# Patient Record
Sex: Male | Born: 1985 | Race: Black or African American | Hispanic: No | Marital: Married | Smoking: Never smoker
Health system: Southern US, Community
[De-identification: ages and names within clinical notes are randomized; demographics above are authoritative.]

## PROBLEM LIST (undated history)

## (undated) DIAGNOSIS — G8929 Other chronic pain: Secondary | ICD-10-CM

## (undated) DIAGNOSIS — R519 Headache, unspecified: Secondary | ICD-10-CM

## (undated) DIAGNOSIS — E669 Obesity, unspecified: Secondary | ICD-10-CM

## (undated) DIAGNOSIS — M1990 Unspecified osteoarthritis, unspecified site: Secondary | ICD-10-CM

## (undated) DIAGNOSIS — I499 Cardiac arrhythmia, unspecified: Secondary | ICD-10-CM

## (undated) DIAGNOSIS — E785 Hyperlipidemia, unspecified: Secondary | ICD-10-CM

## (undated) DIAGNOSIS — I10 Essential (primary) hypertension: Secondary | ICD-10-CM

## (undated) DIAGNOSIS — F32A Depression, unspecified: Secondary | ICD-10-CM

## (undated) HISTORY — DX: Depression, unspecified: F32.A

## (undated) HISTORY — DX: Hyperlipidemia, unspecified: E78.5

## (undated) HISTORY — PX: ANKLE SURGERY: SHX546

## (undated) HISTORY — DX: Obesity, unspecified: E66.9

## (undated) HISTORY — DX: Headache, unspecified: R51.9

## (undated) HISTORY — DX: Other chronic pain: G89.29

---

## 2010-04-29 HISTORY — PX: ANKLE FRACTURE SURGERY: SHX122

## 2020-05-11 DIAGNOSIS — Z1152 Encounter for screening for COVID-19: Secondary | ICD-10-CM | POA: Diagnosis not present

## 2020-05-21 ENCOUNTER — Encounter (HOSPITAL_BASED_OUTPATIENT_CLINIC_OR_DEPARTMENT_OTHER): Payer: Self-pay | Admitting: Emergency Medicine

## 2020-05-21 ENCOUNTER — Emergency Department (HOSPITAL_BASED_OUTPATIENT_CLINIC_OR_DEPARTMENT_OTHER): Payer: BC Managed Care – PPO

## 2020-05-21 ENCOUNTER — Emergency Department (HOSPITAL_BASED_OUTPATIENT_CLINIC_OR_DEPARTMENT_OTHER)
Admission: EM | Admit: 2020-05-21 | Discharge: 2020-05-21 | Disposition: A | Discharge status: Home / Self Care | Payer: BC Managed Care – PPO | Attending: Emergency Medicine | Admitting: Emergency Medicine

## 2020-05-21 ENCOUNTER — Other Ambulatory Visit: Payer: Self-pay

## 2020-05-21 DIAGNOSIS — R030 Elevated blood-pressure reading, without diagnosis of hypertension: Secondary | ICD-10-CM | POA: Diagnosis not present

## 2020-05-21 DIAGNOSIS — R519 Headache, unspecified: Secondary | ICD-10-CM | POA: Diagnosis not present

## 2020-05-21 DIAGNOSIS — D649 Anemia, unspecified: Secondary | ICD-10-CM | POA: Diagnosis not present

## 2020-05-21 DIAGNOSIS — I10 Essential (primary) hypertension: Secondary | ICD-10-CM | POA: Insufficient documentation

## 2020-05-21 DIAGNOSIS — D509 Iron deficiency anemia, unspecified: Secondary | ICD-10-CM | POA: Insufficient documentation

## 2020-05-21 HISTORY — DX: Essential (primary) hypertension: I10

## 2020-05-21 LAB — BASIC METABOLIC PANEL
Anion gap: 8 (ref 5–15)
BUN: 11 mg/dL (ref 6–20)
CO2: 23 mmol/L (ref 22–32)
Calcium: 8.7 mg/dL — ABNORMAL LOW (ref 8.9–10.3)
Chloride: 104 mmol/L (ref 98–111)
Creatinine, Ser: 1.03 mg/dL (ref 0.61–1.24)
GFR, Estimated: >60 mL/min (ref >60)
Glucose, Bld: 109 mg/dL — ABNORMAL HIGH (ref 70–99)
Potassium: 4 mmol/L (ref 3.5–5.1)
Sodium: 135 mmol/L (ref 135–145)

## 2020-05-21 LAB — CBC WITH DIFFERENTIAL/PLATELET
Abs Immature Granulocytes: 0.01 10*3/uL (ref 0.00–0.07)
Basophils Absolute: 0 10*3/uL (ref 0.0–0.1)
Basophils Relative: 0 %
Eosinophils Absolute: 0.1 10*3/uL (ref 0.0–0.5)
Eosinophils Relative: 1 %
HCT: 38.6 % — ABNORMAL LOW (ref 39.0–52.0)
Hemoglobin: 12.2 g/dL — ABNORMAL LOW (ref 13.0–17.0)
Immature Granulocytes: 0 %
Lymphocytes Relative: 35 %
Lymphs Abs: 2.1 10*3/uL (ref 0.7–4.0)
MCH: 23.6 pg — ABNORMAL LOW (ref 26.0–34.0)
MCHC: 31.6 g/dL (ref 30.0–36.0)
MCV: 74.5 fL — ABNORMAL LOW (ref 80.0–100.0)
Monocytes Absolute: 0.5 10*3/uL (ref 0.1–1.0)
Monocytes Relative: 8 %
Neutro Abs: 3.4 10*3/uL (ref 1.7–7.7)
Neutrophils Relative %: 56 %
Platelets: 249 10*3/uL (ref 150–400)
RBC: 5.18 MIL/uL (ref 4.22–5.81)
RDW: 15.7 % — ABNORMAL HIGH (ref 11.5–15.5)
WBC: 6 10*3/uL (ref 4.0–10.5)
nRBC: 0 % (ref 0.0–0.2)

## 2020-05-21 MED ORDER — DIPHENHYDRAMINE HCL 25 MG PO CAPS
50.0000 mg | ORAL_CAPSULE | Freq: Once | ORAL | Status: AC
  Administered 2020-05-21: 50 mg via ORAL
  Filled 2020-05-21: qty 2

## 2020-05-21 MED ORDER — METOCLOPRAMIDE HCL 5 MG/ML IJ SOLN
10.0000 mg | Freq: Once | INTRAMUSCULAR | Status: AC
  Administered 2020-05-21: 10 mg via INTRAMUSCULAR
  Filled 2020-05-21: qty 2

## 2020-05-21 NOTE — ED Provider Notes (Signed)
[facility] EMERGENCY DEPARTMENT Provider Note   CSN: [id number] Arrival date & time: 05/21/20  5093     History Chief Complaint  Patient presents with  . Headache    Chris Carlson is a 35 y.o. male.  Patient presents to the emergency department for evaluation of high blood pressure and headache.  Patient states that he has had a generalized headache over the past 1 week.  He has had nausea at times without vomiting.  He has had mild nasal congestion but no sinus pressure or tooth aches.  No vision disturbances.  He denies associated body aches, chills, fever.  No chest pain or shortness of breath.  He has not been coughing.  No known COVID contacts.  Patient states that he was not previously on blood pressure medication.  It has been years since he has seen a primary care doctor for general physical.  Patient states that he checked his blood pressure at home yesterday and it was in the low 200s systolic range.        Past Medical History:  Diagnosis Date  . Hypertension     There are no problems to display for this patient.   History reviewed. No pertinent surgical history.     History reviewed. No pertinent family history.  Social History   Tobacco Use  . Smoking status: Never Smoker  Substance Use Topics  . Alcohol use: Never  . Drug use: Never    Home Medications Prior to Admission medications   Not on File    Allergies    Patient has no allergy information on record.  Review of Systems   Review of Systems  Constitutional: Negative for fever.  HENT: Positive for congestion. Negative for dental problem, rhinorrhea and sinus pressure.   Eyes: Negative for photophobia, discharge, redness and visual disturbance.  Respiratory: Negative for shortness of breath.   Cardiovascular: Negative for chest pain.  Gastrointestinal: Positive for nausea. Negative for vomiting.  Musculoskeletal: Negative for gait problem, neck pain and neck stiffness.  Skin:  Negative for rash.  Neurological: Positive for headaches. Negative for syncope, speech difficulty, weakness, light-headedness and numbness.  Psychiatric/Behavioral: Negative for confusion.    Physical Exam Updated Vital Signs BP 133/67   Pulse 80   Temp 98.4 F (36.9 C) (Oral)   Resp 13   Ht 5\' 8"  (1.727 m)   Wt (!) 167.8 kg   SpO2 100%   BMI 56.26 kg/m   Physical Exam Vitals and nursing note reviewed.  Constitutional:      Appearance: He is well-developed and well-nourished.  HENT:     Head: Normocephalic and atraumatic.     Right Ear: Tympanic membrane, ear canal and external ear normal.     Left Ear: Tympanic membrane, ear canal and external ear normal.     Nose: Nose normal.     Mouth/Throat:     Mouth: Oropharynx is clear and moist and mucous membranes are normal.     Pharynx: Uvula midline.  Eyes:     General: Lids are normal.     Extraocular Movements: EOM normal.     Conjunctiva/sclera: Conjunctivae normal.     Pupils: Pupils are equal, round, and reactive to light.  Cardiovascular:     Rate and Rhythm: Normal rate and regular rhythm.  Pulmonary:     Effort: Pulmonary effort is normal.     Breath sounds: Normal breath sounds.  Abdominal:     Palpations: Abdomen is soft.  Tenderness: There is no abdominal tenderness.  Musculoskeletal:        General: Normal range of motion.     Cervical back: Normal range of motion and neck supple. No tenderness or bony tenderness. Normal range of motion.  Skin:    General: Skin is warm and dry.  Neurological:     Mental Status: He is alert and oriented to person, place, and time.     GCS: GCS eye subscore is 4. GCS verbal subscore is 5. GCS motor subscore is 6.     Cranial Nerves: No cranial nerve deficit.     Sensory: No sensory deficit.     Motor: No abnormal muscle tone.     Coordination: He displays a negative Romberg sign. Coordination normal.     Gait: Gait normal.     Deep Tendon Reflexes: Strength normal and  reflexes are normal and symmetric.  Psychiatric:        Mood and Affect: Mood and affect normal.     ED Results / Procedures / Treatments   Labs (all labs ordered are listed, but only abnormal results are displayed) Labs Reviewed  CBC WITH DIFFERENTIAL/PLATELET  BASIC METABOLIC PANEL    ED ECG REPORT   Date: 05/21/2020  Rate: 78  Rhythm: normal sinus rhythm  QRS Axis: normal  Intervals: normal  ST/T Wave abnormalities: nonspecific T wave changes  Conduction Disutrbances:none  Narrative Interpretation:   Old EKG Reviewed: none available  I have personally reviewed the EKG tracing and agree with the computerized printout as noted.  Radiology CT Head Wo Contrast  Result Date: 05/21/2020 CLINICAL DATA:  Headache x1 week EXAM: CT HEAD WITHOUT CONTRAST TECHNIQUE: Contiguous axial images were obtained from the base of the skull through the vertex without intravenous contrast. COMPARISON:  None. FINDINGS: Brain: No evidence of acute infarction, hemorrhage, hydrocephalus, extra-axial collection or mass lesion/mass effect. Vascular: No hyperdense vessel or unexpected calcification. Skull: Normal. Negative for fracture or focal lesion. Sinuses/Orbits: The visualized paranasal sinuses are essentially clear. The mastoid air cells are unopacified. Other: None. IMPRESSION: Normal head CT. Electronically Signed   By: Charline Bills M.D.   On: 05/21/2020 11:03    Procedures Procedures (including critical care time)  Medications Ordered in ED Medications  metoCLOPramide (REGLAN) injection 10 mg (has no administration in time range)  diphenhydrAMINE (BENADRYL) capsule 50 mg (has no administration in time range)    ED Course  I have reviewed the triage vital signs and the nursing notes.  Pertinent labs & imaging results that were available during my care of the patient were reviewed by me and considered in my medical decision making (see chart for details).  Patient seen and examined.   CT ordered in triage, was negative.  Will check basic labs given that patient has not seen a doctor in a long time and potentially has uncontrolled high blood pressure.  EKG reviewed.  We will give IM Reglan and oral Benadryl for treatment of headache.  We discussed the importance of PCP follow-up for blood pressure rechecks.  RN to perform manual blood pressure.  Vital signs reviewed and are as follows: BP 133/67   Pulse 80   Temp 98.4 F (36.9 C) (Oral)   Resp 13   Ht 5\' 8"  (1.727 m)   Wt (!) 167.8 kg   SpO2 100%   BMI 56.26 kg/m   1:07 PM patient rechecked.  Reviewed results of lab work including mild anemia.  He is a little sleepy but  states that his headache is down to a 2 out of 10.  Plan for discharged home at this time.  Again stressed need for primary care follow-up.  Will provide referrals.  Patient urged to return with worsening symptoms or other concerns. Patient verbalized understanding and agrees with plan.      MDM Rules/Calculators/A&P                          Patient without high-risk features of headache including: sudden onset/thunderclap HA, no similar headache in past, altered mental status, accompanying seizure, headache with exertion, age > 43, history of immunocompromise, neck or shoulder pain, fever, use of anticoagulation, family history of spontaneous SAH, concomitant drug use, toxic exposure.   Patient has a normal complete neurological exam, normal vital signs, normal level of consciousness, no signs of meningismus, is well-appearing/non-toxic appearing, no signs of trauma.   CT imaging without abnormalities.   No dangerous or life-threatening conditions suspected or identified by history, physical exam, and by work-up. No indications for hospitalization identified.     Final Clinical Impression(s) / ED Diagnoses Final diagnoses:  Acute nonintractable headache, unspecified headache type  Elevated blood pressure reading  Microcytic anemia    Rx /  DC Orders ED Discharge Orders    None       Renne Crigler, PA-C 05/21/20 1308    Alvira Monday, MD 05/22/20 1153

## 2020-05-21 NOTE — ED Triage Notes (Signed)
Pt arrives pov with driver, c/o HA and dizziness. Pt endorses hx of HTN, not taking medications.

## 2020-05-21 NOTE — Discharge Instructions (Signed)
Please read and follow all provided instructions.  Your diagnoses today include:  1. Acute nonintractable headache, unspecified headache type   2. Elevated blood pressure reading   3. Microcytic anemia     Tests performed today include:  CT of your head which was normal and did not show any serious cause of your headache Blood cell counts (white, red, and platelets) - slightly low red blood cell count Electrolytes  Kidney function test - was normal  Vital signs. See below for your results today.   Medications:  In the Emergency Department you received:  Reglan - antinausea/headache medication  Benadryl - antihistamine to counteract potential side effects of reglan  Take any prescribed medications only as directed.  Additional information:  Follow any educational materials contained in this packet.  You are having a headache. No specific cause was found today for your headache. It may have been a migraine or other cause of headache. Stress, anxiety, fatigue, and depression are common triggers for headaches.   Your headache today does not appear to be life-threatening or require hospitalization, but often the exact cause of headaches is not determined in the emergency department. Therefore, follow-up with your doctor is very important to find out what may have caused your headache and whether or not you need any further diagnostic testing or treatment.   Sometimes headaches can appear benign (not harmful), but then more serious symptoms can develop which should prompt an immediate re-evaluation by your doctor or the emergency department.  BE VERY CAREFUL not to take multiple medicines containing Tylenol (also called acetaminophen). Doing so can lead to an overdose which can damage your liver and cause liver failure and possibly death.   Follow-up instructions: Please follow-up with your primary care provider in the next 3 days for further evaluation of your symptoms.   Return  instructions:   Please return to the Emergency Department if you experience worsening symptoms.  Return if the medications do not resolve your headache, if it recurs, or if you have multiple episodes of vomiting or cannot keep down fluids.  Return if you have a change from the usual headache.  RETURN IMMEDIATELY IF you:  Develop a sudden, severe headache  Develop confusion or become poorly responsive or faint  Develop a fever above 100.62F or problem breathing  Have a change in speech, vision, swallowing, or understanding  Develop new weakness, numbness, tingling, incoordination in your arms or legs  Have a seizure  Please return if you have any other emergent concerns.  Additional Information:  Your vital signs today were: BP (!) 160/78 (BP Location: Left Arm)   Pulse 80   Temp 98.4 F (36.9 C) (Oral)   Resp 16   Ht 5\' 8"  (1.727 m)   Wt (!) 167.8 kg   SpO2 99%   BMI 56.26 kg/m  If your blood pressure (BP) was elevated above 135/85 this visit, please have this repeated by your doctor within one month. --------------

## 2020-05-31 ENCOUNTER — Other Ambulatory Visit: Payer: Self-pay

## 2020-05-31 ENCOUNTER — Ambulatory Visit (INDEPENDENT_AMBULATORY_CARE_PROVIDER_SITE_OTHER): Payer: BC Managed Care – PPO | Admitting: Medical

## 2020-05-31 ENCOUNTER — Encounter: Payer: Self-pay | Admitting: Medical

## 2020-05-31 VITALS — BP 132/82 | HR 86 | Ht 67.0 in | Wt >= 6400 oz

## 2020-05-31 DIAGNOSIS — Z6841 Body Mass Index (BMI) 40.0 and over, adult: Secondary | ICD-10-CM | POA: Diagnosis not present

## 2020-05-31 DIAGNOSIS — I10 Essential (primary) hypertension: Secondary | ICD-10-CM | POA: Insufficient documentation

## 2020-05-31 DIAGNOSIS — R519 Headache, unspecified: Secondary | ICD-10-CM | POA: Diagnosis not present

## 2020-05-31 DIAGNOSIS — D509 Iron deficiency anemia, unspecified: Secondary | ICD-10-CM | POA: Insufficient documentation

## 2020-05-31 DIAGNOSIS — G8929 Other chronic pain: Secondary | ICD-10-CM | POA: Insufficient documentation

## 2020-05-31 DIAGNOSIS — J351 Hypertrophy of tonsils: Secondary | ICD-10-CM | POA: Insufficient documentation

## 2020-05-31 DIAGNOSIS — Z8249 Family history of ischemic heart disease and other diseases of the circulatory system: Secondary | ICD-10-CM

## 2020-05-31 DIAGNOSIS — Z8042 Family history of malignant neoplasm of prostate: Secondary | ICD-10-CM | POA: Insufficient documentation

## 2020-05-31 MED ORDER — ATENOLOL 25 MG PO TABS: 25.0000 mg | ORAL_TABLET | Freq: Every day | ORAL | 2 refills | Status: DC

## 2020-05-31 MED ORDER — BUTALBITAL-APAP-CAFFEINE 50-325-40 MG PO TABS: 1.0000 | ORAL_TABLET | Freq: Four times a day (QID) | ORAL | 0 refills | Status: DC | PRN

## 2020-05-31 NOTE — Progress Notes (Signed)
Subjective:  Chris Carlson is a 35 y.o. male who presents for Chief Complaint  Patient presents with  . New Patient (Initial Visit)    Blood pressure issues and migraines      Here as a new patient.  Was seeing [facility] when younger.  He notes having a lot of migraines of late, sometimes headache awake him out of sleep.  Been having elevated blood pressures,  Dizziness.  Not feeling well.  Checked BP a few weeks ago, 200 SBP readings.  Occasionally gets numbness in fingers bilat.  No vision change, no blurred vision or double vision, no weakness, no syncope, no chest pain, no SOB, no slurred speech.    Went to hospital recently for these concerns.  BP was elevated at first but improved a little in the emergency dept.    He does report snoring, doesn't wake rested, gets daytime somnolence.  No witnessed apnea.    Uncle has hx/o sleep apnea.  Aunt had it before she passed.  Grandmother had sleep apnea.  No other aggravating or relieving factors.    No other c/o.  Past Medical History:  Diagnosis Date  . Chronic headache    since childhood, no prior neurology consult  . Depression   . Obesity    Family History  Problem Relation Age of Onset  . Hypertension Maternal Grandmother   . Diabetes Maternal Grandmother   . Cancer Maternal Grandmother        breast  . Cancer Maternal Grandfather        prostate  . Diabetes Paternal Grandmother   . Hypertension Paternal Grandmother   . Cancer Paternal Grandfather        prostate  . Heart disease Paternal Grandfather        MI  . Stroke Neg Hx      The following portions of the patient's history were reviewed and updated as appropriate: allergies, current medications, past family history, past medical history, past social history, past surgical history and problem list.  ROS Otherwise as in subjective above  Objective: BP 132/82   Pulse 86   Ht 5\' 7"  (1.702 m)   Wt (!) 400 lb 6.4 oz (181.6 kg)   SpO2 98%   BMI  62.71 kg/m   General appearance: alert, no distress, well developed, well nourished, obese African American male HEENT: normocephalic, sclerae anicteric, conjunctiva pink and moist, TMs pearly, nares patent, no discharge or erythema, pharynx with bilat 3+ tonsils Oral cavity: MMM, no lesions Neck: supple, no lymphadenopathy, no thyromegaly, no masses, no bruits or JVD Heart: RRR, normal S1, S2, no murmurs Lungs: CTA bilaterally, no wheezes, rhonchi, or rales Pulses: 2+ radial pulses, 2+ pedal pulses, normal cap refill Ext: no edema   Head CT 05/21/20, normal  BMET lab from 05/21/20 showing glucose 109, calcium low at 8.7, otherwise normal CBC lab showing hgb low at 12.2 and microcytosis   Assessment: Encounter Diagnoses  Name Primary?  . Chronic nonintractable headache, unspecified headache type Yes  . Essential hypertension, benign   . Microcytic anemia   . BMI 60.0-69.9, adult (HCC)   . Family history of prostate cancer   . Family history of hypertension   . Hypertrophy of tonsil      Plan: I reviewed his health history and updated the chart record.  I reviewed his recent emergency department notes from May 21, 2018.  I reviewed his head scan, labs, EKG.  The cause of his headaches could be multifactorial.  I suspect sleep apnea.  There seems to be some underlying hypertension, there is also quite enlarged tonsils which could contribute to sleep apnea.  Given his high reading of over 200 systolic the other day and given the long history of headaches, I will start him on atenolol 25 mg daily for both blood pressure and headache prevention  Begin Fioricet as needed for more severe headache.  Avoid NSAIDs.  He can use Tylenol as needed for milder headaches.  Referral for sleep study.  Consider consult with ENT given the tonsils  We discussed the risk of uncontrolled sleep apnea and hypertension.  Of note from the hospital visit he had some microcytic anemia.  we will  defer this to a later discussion  Family history of prostate cancer in numerous male relatives.  We will begin prostate cancer screening at age 10  We discussed risk of obesity.  He will consider referral to bariatric clinic   Dysen was seen today for new patient (initial visit).  Diagnoses and all orders for this visit:  Chronic nonintractable headache, unspecified headache type  Essential hypertension, benign  Microcytic anemia  BMI 60.0-69.9, adult (HCC)  Family history of prostate cancer  Family history of hypertension  Hypertrophy of tonsil  Other orders -     atenolol (TENORMIN) 25 MG tablet; Take 1 tablet (25 mg total) by mouth daily. -     butalbital-acetaminophen-caffeine (FIORICET) 50-325-40 MG tablet; Take 1-2 tablets by mouth every 6 (six) hours as needed for headache.   Follow up: f/u pending sleep study

## 2020-06-12 DIAGNOSIS — G4730 Sleep apnea, unspecified: Secondary | ICD-10-CM | POA: Diagnosis not present

## 2020-06-27 ENCOUNTER — Telehealth: Payer: Self-pay | Admitting: Medical

## 2020-06-27 NOTE — Telephone Encounter (Signed)
Schedule f/u on sleep study  Hold onto SNAP results until appt

## 2020-06-28 NOTE — Telephone Encounter (Signed)
Patient has appointment scheduled.

## 2020-07-26 ENCOUNTER — Encounter: Payer: Self-pay | Admitting: Medical

## 2020-07-26 ENCOUNTER — Other Ambulatory Visit: Payer: Self-pay | Admitting: Medical

## 2020-07-26 ENCOUNTER — Ambulatory Visit (INDEPENDENT_AMBULATORY_CARE_PROVIDER_SITE_OTHER): Payer: BC Managed Care – PPO | Admitting: Medical

## 2020-07-26 ENCOUNTER — Other Ambulatory Visit: Payer: Self-pay

## 2020-07-26 VITALS — BP 130/94 | HR 86 | Ht 67.0 in | Wt >= 6400 oz

## 2020-07-26 DIAGNOSIS — Z1159 Encounter for screening for other viral diseases: Secondary | ICD-10-CM | POA: Diagnosis not present

## 2020-07-26 DIAGNOSIS — Z6841 Body Mass Index (BMI) 40.0 and over, adult: Secondary | ICD-10-CM

## 2020-07-26 DIAGNOSIS — Z131 Encounter for screening for diabetes mellitus: Secondary | ICD-10-CM | POA: Diagnosis not present

## 2020-07-26 DIAGNOSIS — D509 Iron deficiency anemia, unspecified: Secondary | ICD-10-CM | POA: Diagnosis not present

## 2020-07-26 DIAGNOSIS — G8929 Other chronic pain: Secondary | ICD-10-CM

## 2020-07-26 DIAGNOSIS — Z13220 Encounter for screening for lipoid disorders: Secondary | ICD-10-CM | POA: Insufficient documentation

## 2020-07-26 DIAGNOSIS — Z113 Encounter for screening for infections with a predominantly sexual mode of transmission: Secondary | ICD-10-CM | POA: Diagnosis not present

## 2020-07-26 DIAGNOSIS — Z1211 Encounter for screening for malignant neoplasm of colon: Secondary | ICD-10-CM

## 2020-07-26 DIAGNOSIS — Z8042 Family history of malignant neoplasm of prostate: Secondary | ICD-10-CM

## 2020-07-26 DIAGNOSIS — Z0000 Encounter for general adult medical examination without abnormal findings: Secondary | ICD-10-CM | POA: Diagnosis not present

## 2020-07-26 DIAGNOSIS — G4733 Obstructive sleep apnea (adult) (pediatric): Secondary | ICD-10-CM | POA: Insufficient documentation

## 2020-07-26 DIAGNOSIS — R519 Headache, unspecified: Secondary | ICD-10-CM

## 2020-07-26 DIAGNOSIS — Z23 Encounter for immunization: Secondary | ICD-10-CM | POA: Insufficient documentation

## 2020-07-26 MED ORDER — ATENOLOL 50 MG PO TABS
50.0000 mg | ORAL_TABLET | Freq: Every day | ORAL | 3 refills | Status: DC
Start: 2020-07-26 — End: 2021-11-16

## 2020-07-26 NOTE — Progress Notes (Signed)
Subjective:   HPI  Chris Carlson is a 35 y.o. male who presents for Chief Complaint  Patient presents with  . Annual Exam    Pt present for physical with fasting labs. Pt has no other concerns today     Patient Care Team: Celester Lech, Kermit Balo, PA-C as PCP - General (Family Medicine) Sees dentist Sees eye doctor  Concerns: Here for physical and recheck on recent sleep study  Reviewed their medical, surgical, family, social, medication, and allergy history and updated chart as appropriate.  Past Medical History:  Diagnosis Date  . Chronic headache    since childhood, no prior neurology consult  . Depression   . Obesity     Past Surgical History:  Procedure Laterality Date  . ANKLE SURGERY     right    Family History  Problem Relation Age of Onset  . Anemia Mother   . Hypertension Maternal Grandmother   . Diabetes Maternal Grandmother   . Cancer Maternal Grandmother        breast  . Cancer Maternal Grandfather        prostate  . Diabetes Paternal Grandmother   . Hypertension Paternal Grandmother   . Cancer Paternal Grandfather        prostate  . Heart disease Paternal Grandfather        MI  . Stroke Neg Hx      Current Outpatient Medications:  .  atenolol (TENORMIN) 50 MG tablet, Take 1 tablet (50 mg total) by mouth daily., Disp: 90 tablet, Rfl: 3 .  butalbital-acetaminophen-caffeine (FIORICET) 50-325-40 MG tablet, Take 1-2 tablets by mouth every 6 (six) hours as needed for headache., Disp: 20 tablet, Rfl: 0  No Known Allergies     Review of Systems Constitutional: -fever, -chills, -sweats, -unexpected weight change, -decreased appetite, -fatigue Allergy: -sneezing, -itching, -congestion Dermatology: -changing moles, --rash, -lumps ENT: -runny nose, -ear pain, -sore throat, -hoarseness, -sinus pain, -teeth pain, - ringing in ears, -hearing loss, -nosebleeds Cardiology: -chest pain, -palpitations, -swelling, -difficulty breathing when lying flat, -waking  up short of breath Respiratory: -cough, -shortness of breath, -difficulty breathing with exercise or exertion, -wheezing, -coughing up blood Gastroenterology: -abdominal pain, -nausea, -vomiting, -diarrhea, -constipation, -blood in stool, -changes in bowel movement, -difficulty swallowing or eating Hematology: -bleeding, -bruising  Musculoskeletal: -joint aches, -muscle aches, -joint swelling, -back pain, -neck pain, -cramping, -changes in gait Ophthalmology: denies vision changes, eye redness, itching, discharge Urology: -burning with urination, -difficulty urinating, -blood in urine, -urinary frequency, -urgency, -incontinence Neurology: -headache, -weakness, -tingling, -numbness, -memory loss, -falls, -dizziness Psychology: -depressed mood, -agitation, -sleep problems Male GU: no testicular mass, pain, no lymph nodes swollen, no swelling, no rash.     Objective:  BP (!) 130/94   Pulse 86   Ht 5\' 7"  (1.702 m)   Wt (!) 400 lb 9.6 oz (181.7 kg)   SpO2 97%   BMI 62.74 kg/m   General appearance: alert, no distress, WD/WN, African American male Skin: no worrisome lesions HEENT: normocephalic, conjunctiva/corneas normal, sclerae anicteric, PERRLA, EOMi, nares patent, no discharge or erythema, pharynx normal Oral cavity: MMM, tongue normal, teeth normal Neck: supple, no lymphadenopathy, no thyromegaly, no masses, normal ROM, no bruits Chest: non tender, normal shape and expansion Heart: RRR, normal S1, S2, no murmurs Lungs: CTA bilaterally, no wheezes, rhonchi, or rales Abdomen: +bs, soft, non tender, non distended, no masses, no hepatomegaly, no splenomegaly, no bruits Back: non tender, normal ROM, no scoliosis Musculoskeletal: upper extremities non tender, no obvious deformity, normal ROM  throughout, lower extremities non tender, no obvious deformity, normal ROM throughout Extremities: no edema, no cyanosis, no clubbing Pulses: 2+ symmetric, upper and lower extremities, normal cap  refill Neurological: alert, oriented x 3, CN2-12 intact, strength normal upper extremities and lower extremities, sensation normal throughout, DTRs 2+ throughout, no cerebellar signs, gait normal Psychiatric: normal affect, behavior normal, pleasant  GU: normal male external genitalia,circumcised, nontender, no masses, no hernia, no lymphadenopathy Rectal: deferred   Assessment and Plan :   Encounter Diagnoses  Name Primary?  . Encounter for health maintenance examination in adult Yes  . Need for Tdap vaccination   . Screening for diabetes mellitus   . Screening for lipid disorders   . Screen for STD (sexually transmitted disease)   . Chronic nonintractable headache, unspecified headache type   . OSA (obstructive sleep apnea)   . Family history of prostate cancer   . Microcytic anemia   . Encounter for hepatitis C screening test for low risk patient   . BMI 60.0-69.9, adult (HCC)   . Colon cancer screening   . Need for vaccination     Today you had a preventative care visit or wellness visit.    Topics today may have included healthy lifestyle, diet, exercise, preventative care, vaccinations, sick and well care, proper use of emergency dept and after hours care, as well as other concerns.     Recommendations: Continue to return yearly for your annual wellness and preventative care visits.  This gives [place] a chance to discuss healthy lifestyle, exercise, vaccinations, review your chart record, and perform screenings where appropriate.  I recommend you see your eye doctor yearly for routine vision care.  I recommend you see your dentist yearly for routine dental care including hygiene visits twice yearly.   Vaccination recommendations were reviewed  Counseled on the Tdap (tetanus, diptheria, and acellular pertussis) vaccine.  Vaccine information sheet given. Tdap vaccine given after consent obtained.  Advised yearly flu shot    Screening for cancer: Colon cancer screening:   Age 30  Cancer screening You should do a monthly self testicular exam  We discussed PSA, prostate exam, and prostate cancer screening risks/benefits.  Age 89   Skin cancer screening: Check your skin regularly for new changes, growing lesions, or other lesions of concern Come in for evaluation if you have skin lesions of concern.  Lung cancer screening: If you have a greater than 30 pack year history of tobacco use, then you qualify for lung cancer screening with a chest CT scan  We currently don't have screenings for other cancers besides breast, cervical, colon, and lung cancers.  If you have a strong family history of cancer or have other cancer screening concerns, please let me know.    Bone health: Get at least 150 minutes of aerobic exercise weekly Get weight bearing exercise at least once weekly   Heart health: Get at least 150 minutes of aerobic exercise weekly Limit alcohol It is important to maintain a healthy blood pressure and healthy cholesterol numbers    Separate significant issues discussed: Obesity - referral to bariatric surgery clinic  OSA , nocturnal hypoxia -referral for CPAP  Hypertension and Chronic headaches - increase atenolol dose   Kalan was seen today for annual exam.  Diagnoses and all orders for this visit:  Encounter for health maintenance examination in adult -     CBC with Differential/Platelet -     Iron -     Lipid panel -  Hemoglobin A1c -     TSH -     Comprehensive metabolic panel -     HIV Antibody (routine testing w rflx) -     RPR -     Hepatitis C antibody -     Hepatitis B surface antigen -     GC/Chlamydia Probe Amp  Need for Tdap vaccination  Screening for diabetes mellitus -     Hemoglobin A1c  Screening for lipid disorders -     Lipid panel  Screen for STD (sexually transmitted disease) -     HIV Antibody (routine testing w rflx) -     RPR -     Hepatitis C antibody -     Hepatitis B surface  antigen -     GC/Chlamydia Probe Amp  Chronic nonintractable headache, unspecified headache type  OSA (obstructive sleep apnea) -     Amb Referral to Bariatric Surgery  Family history of prostate cancer  Microcytic anemia -     CBC with Differential/Platelet -     Iron  Encounter for hepatitis C screening test for low risk patient -     Hepatitis C antibody  BMI 60.0-69.9, adult (HCC) -     Amb Referral to Bariatric Surgery  Colon cancer screening -     Fecal occult blood, imunochemical([facility])  Need for vaccination -     Moderna Covid-19 Booster  Other orders -     Tdap vaccine greater than or equal to 7yo IM -     atenolol (TENORMIN) 50 MG tablet; Take 1 tablet (50 mg total) by mouth daily.     Follow-up pending labs, yearly for physical

## 2020-07-27 ENCOUNTER — Encounter: Payer: Self-pay | Admitting: Medical

## 2020-07-27 LAB — LIPID PANEL
Chol/HDL Ratio: 6.3 ratio — ABNORMAL HIGH (ref 0.0–5.0)
Cholesterol, Total: 257 mg/dL — ABNORMAL HIGH (ref 100–199)
HDL: 41 mg/dL (ref >39)
LDL Chol Calc (NIH): 193 mg/dL — ABNORMAL HIGH (ref 0–99)
Triglycerides: 124 mg/dL (ref 0–149)
VLDL Cholesterol Cal: 23 mg/dL (ref 5–40)

## 2020-07-27 LAB — COMPREHENSIVE METABOLIC PANEL
ALT: 23 IU/L (ref 0–44)
AST: 16 IU/L (ref 0–40)
Albumin/Globulin Ratio: 1.3 (ref 1.2–2.2)
Albumin: 4.4 g/dL (ref 4.0–5.0)
Alkaline Phosphatase: 50 IU/L (ref 44–121)
BUN/Creatinine Ratio: 8 — ABNORMAL LOW (ref 9–20)
BUN: 9 mg/dL (ref 6–20)
Bilirubin Total: 0.3 mg/dL (ref 0.0–1.2)
CO2: 18 mmol/L — ABNORMAL LOW (ref 20–29)
Calcium: 9.6 mg/dL (ref 8.7–10.2)
Chloride: 100 mmol/L (ref 96–106)
Creatinine, Ser: 1.1 mg/dL (ref 0.76–1.27)
Globulin, Total: 3.4 g/dL (ref 1.5–4.5)
Glucose: 80 mg/dL (ref 65–99)
Potassium: 4.2 mmol/L (ref 3.5–5.2)
Sodium: 139 mmol/L (ref 134–144)
Total Protein: 7.8 g/dL (ref 6.0–8.5)
eGFR: 89 mL/min/{1.73_m2} (ref >59)

## 2020-07-27 LAB — CBC WITH DIFFERENTIAL/PLATELET
Basophils Absolute: 0 10*3/uL (ref 0.0–0.2)
Basos: 0 %
EOS (ABSOLUTE): 0.1 10*3/uL (ref 0.0–0.4)
Eos: 1 %
Hematocrit: 40.9 % (ref 37.5–51.0)
Hemoglobin: 13.1 g/dL (ref 13.0–17.7)
Immature Grans (Abs): 0 10*3/uL (ref 0.0–0.1)
Immature Granulocytes: 0 %
Lymphocytes Absolute: 3.1 10*3/uL (ref 0.7–3.1)
Lymphs: 42 %
MCH: 23.4 pg — ABNORMAL LOW (ref 26.6–33.0)
MCHC: 32 g/dL (ref 31.5–35.7)
MCV: 73 fL — ABNORMAL LOW (ref 79–97)
Monocytes Absolute: 0.6 10*3/uL (ref 0.1–0.9)
Monocytes: 8 %
Neutrophils Absolute: 3.6 10*3/uL (ref 1.4–7.0)
Neutrophils: 49 %
Platelets: 298 10*3/uL (ref 150–450)
RBC: 5.59 x10E6/uL (ref 4.14–5.80)
RDW: 14.9 % (ref 11.6–15.4)
WBC: 7.4 10*3/uL (ref 3.4–10.8)

## 2020-07-27 LAB — RPR: RPR Ser Ql: NONREACTIVE

## 2020-07-27 LAB — HIV ANTIBODY (ROUTINE TESTING W REFLEX): HIV Screen 4th Generation wRfx: NONREACTIVE

## 2020-07-27 LAB — HEPATITIS B SURFACE ANTIGEN: Hepatitis B Surface Ag: NEGATIVE

## 2020-07-27 LAB — IRON: Iron: 78 ug/dL (ref 38–169)

## 2020-07-27 LAB — HEMOGLOBIN A1C
Est. average glucose Bld gHb Est-mCnc: 126 mg/dL
Hgb A1c MFr Bld: 6 % — ABNORMAL HIGH (ref 4.8–5.6)

## 2020-07-27 LAB — HEPATITIS C ANTIBODY: Hep C Virus Ab: <0.1 s/co ratio (ref 0.0–0.9)

## 2020-07-27 LAB — TSH: TSH: 2.06 u[IU]/mL (ref 0.450–4.500)

## 2020-07-27 NOTE — Progress Notes (Signed)
I will send the letter you provided.

## 2020-07-27 NOTE — Progress Notes (Signed)
There has been changes with referring to [place] [facility] for bariatrics. They want a letter of medical necessity.

## 2020-07-28 NOTE — Progress Notes (Signed)
Referral sent 

## 2020-08-02 ENCOUNTER — Telehealth: Payer: Self-pay

## 2020-08-02 NOTE — Telephone Encounter (Signed)
A representative from [facility] called stating they received a fecal occult blood sample form one of your pts. Chris Carlson but it did not have pts. Name on it so they could not run it. If you wanted to let the pt. Know incase you wanted him to do it again.

## 2020-08-02 NOTE — Telephone Encounter (Signed)
Called pt. LM that stool card could not be completed because it was not labeled with his name on it. I let him know he could pick up another stool card kit here at the office.

## 2020-08-02 NOTE — Telephone Encounter (Signed)
Yes, lets see if he can repeat but make sure he labels it correctly

## 2020-08-03 LAB — SPECIMEN STATUS REPORT

## 2020-08-31 ENCOUNTER — Emergency Department (HOSPITAL_BASED_OUTPATIENT_CLINIC_OR_DEPARTMENT_OTHER): Payer: BC Managed Care – PPO

## 2020-08-31 ENCOUNTER — Encounter (HOSPITAL_BASED_OUTPATIENT_CLINIC_OR_DEPARTMENT_OTHER): Payer: Self-pay | Admitting: *Deleted

## 2020-08-31 ENCOUNTER — Other Ambulatory Visit: Payer: Self-pay

## 2020-08-31 ENCOUNTER — Emergency Department (HOSPITAL_BASED_OUTPATIENT_CLINIC_OR_DEPARTMENT_OTHER)
Admission: EM | Admit: 2020-08-31 | Discharge: 2020-09-01 | Disposition: A | Discharge status: Home / Self Care | Payer: BC Managed Care – PPO | Attending: Emergency Medicine | Admitting: Emergency Medicine

## 2020-08-31 DIAGNOSIS — R079 Chest pain, unspecified: Secondary | ICD-10-CM | POA: Diagnosis not present

## 2020-08-31 DIAGNOSIS — R0789 Other chest pain: Secondary | ICD-10-CM | POA: Diagnosis not present

## 2020-08-31 DIAGNOSIS — I10 Essential (primary) hypertension: Secondary | ICD-10-CM | POA: Diagnosis not present

## 2020-08-31 DIAGNOSIS — Z79899 Other long term (current) drug therapy: Secondary | ICD-10-CM | POA: Insufficient documentation

## 2020-08-31 LAB — BASIC METABOLIC PANEL
Anion gap: 9 (ref 5–15)
BUN: 15 mg/dL (ref 6–20)
CO2: 22 mmol/L (ref 22–32)
Calcium: 8.9 mg/dL (ref 8.9–10.3)
Chloride: 104 mmol/L (ref 98–111)
Creatinine, Ser: 1.07 mg/dL (ref 0.61–1.24)
GFR, Estimated: >60 mL/min (ref >60)
Glucose, Bld: 114 mg/dL — ABNORMAL HIGH (ref 70–99)
Potassium: 3.6 mmol/L (ref 3.5–5.1)
Sodium: 135 mmol/L (ref 135–145)

## 2020-08-31 LAB — CBC
HCT: 39 % (ref 39.0–52.0)
Hemoglobin: 12.3 g/dL — ABNORMAL LOW (ref 13.0–17.0)
MCH: 23.2 pg — ABNORMAL LOW (ref 26.0–34.0)
MCHC: 31.5 g/dL (ref 30.0–36.0)
MCV: 73.6 fL — ABNORMAL LOW (ref 80.0–100.0)
Platelets: 251 10*3/uL (ref 150–400)
RBC: 5.3 MIL/uL (ref 4.22–5.81)
RDW: 15.3 % (ref 11.5–15.5)
WBC: 7.8 10*3/uL (ref 4.0–10.5)
nRBC: 0 % (ref 0.0–0.2)

## 2020-08-31 LAB — TROPONIN I (HIGH SENSITIVITY): Troponin I (High Sensitivity): 5 ng/L (ref <18)

## 2020-08-31 NOTE — ED Provider Notes (Signed)
[facility] EMERGENCY DEPARTMENT Provider Note   CSN: [id number] Arrival date & time: 08/31/20  2005     History Chief Complaint  Patient presents with  . Chest Pain    Chris Carlson is a 35 y.o. male.  Patient presents to the emergency department for evaluation of chest pain.  Patient reports that he has been experiencing intermittent episodes of sharp and aching pain in the chest for the last couple of days.  Episodes usually last about an hour and then resolved.  They are not related to exertion.  He has not identified anything that causes the pain.  No associated shortness of breath.  No nausea, diaphoresis.  He reports that he had similar pains several years ago and had an ER work-up but never did the follow-up.  Cardiac risk factors are obesity and hypertension.        Past Medical History:  Diagnosis Date  . Chronic headache    since childhood, no prior neurology consult  . Depression   . Obesity     Patient Active Problem List   Diagnosis Date Noted  . Encounter for health maintenance examination in adult 07/26/2020  . Need for Tdap vaccination 07/26/2020  . Screen for STD (sexually transmitted disease) 07/26/2020  . Screening for diabetes mellitus 07/26/2020  . Screening for lipid disorders 07/26/2020  . OSA (obstructive sleep apnea) 07/26/2020  . Encounter for hepatitis C screening test for low risk patient 07/26/2020  . Chronic nonintractable headache 05/31/2020  . Essential hypertension, benign 05/31/2020  . Microcytic anemia 05/31/2020  . BMI 60.0-69.9, adult (HCC) 05/31/2020  . Family history of hypertension 05/31/2020  . Family history of prostate cancer 05/31/2020  . Hypertrophy of tonsil 05/31/2020    Past Surgical History:  Procedure Laterality Date  . ANKLE SURGERY     right       Family History  Problem Relation Age of Onset  . Anemia Mother   . Hypertension Maternal Grandmother   . Diabetes Maternal Grandmother   . Cancer  Maternal Grandmother        breast  . Cancer Maternal Grandfather        prostate  . Diabetes Paternal Grandmother   . Hypertension Paternal Grandmother   . Cancer Paternal Grandfather        prostate  . Heart disease Paternal Grandfather        MI  . Stroke Neg Hx     Social History   Tobacco Use  . Smoking status: Never Smoker  . Smokeless tobacco: Never Used  Substance Use Topics  . Alcohol use: Never  . Drug use: Never    Home Medications Prior to Admission medications   Medication Sig Start Date End Date Taking? Authorizing Provider  atenolol (TENORMIN) 50 MG tablet Take 1 tablet (50 mg total) by mouth daily. 07/26/20   Tysinger, Kermit Balo, PA-C  butalbital-acetaminophen-caffeine (FIORICET) 548 [id number] 8661 MG tablet Take 1-2 tablets by mouth every 6 (six) hours as needed for headache. 05/31/20   Tysinger, Kermit Balo, PA-C    Allergies    Patient has no known allergies.  Review of Systems   Review of Systems  Cardiovascular: Positive for chest pain.  All other systems reviewed and are negative.   Physical Exam Updated Vital Signs BP 120/77   Pulse 82   Temp 98.4 F (36.9 C) (Oral)   Resp (!) 23   Ht 5\' 6"  (1.676 m)   Wt (!) 181.4 kg   SpO2 97%  BMI 64.56 kg/m   Physical Exam Vitals and nursing note reviewed.  Constitutional:      General: He is not in acute distress.    Appearance: Normal appearance. He is well-developed. He is obese.  HENT:     Head: Normocephalic and atraumatic.     Right Ear: Hearing normal.     Left Ear: Hearing normal.     Nose: Nose normal.  Eyes:     Conjunctiva/sclera: Conjunctivae normal.     Pupils: Pupils are equal, round, and reactive to light.  Cardiovascular:     Rate and Rhythm: Regular rhythm.     Heart sounds: S1 normal and S2 normal. No murmur heard. No friction rub. No gallop.   Pulmonary:     Effort: Pulmonary effort is normal. No respiratory distress.     Breath sounds: Normal breath sounds.  Chest:     Chest wall:  No tenderness.  Abdominal:     General: Bowel sounds are normal.     Palpations: Abdomen is soft.     Tenderness: There is no abdominal tenderness. There is no guarding or rebound. Negative signs include Murphy's sign and McBurney's sign.     Hernia: No hernia is present.  Musculoskeletal:        General: Normal range of motion.     Cervical back: Normal range of motion and neck supple.  Skin:    General: Skin is warm and dry.     Findings: No rash.  Neurological:     Mental Status: He is alert and oriented to person, place, and time.     GCS: GCS eye subscore is 4. GCS verbal subscore is 5. GCS motor subscore is 6.     Cranial Nerves: No cranial nerve deficit.     Sensory: No sensory deficit.     Coordination: Coordination normal.  Psychiatric:        Speech: Speech normal.        Behavior: Behavior normal.        Thought Content: Thought content normal.     ED Results / Procedures / Treatments   Labs (all labs ordered are listed, but only abnormal results are displayed) Labs Reviewed  BASIC METABOLIC PANEL - Abnormal; Notable for the following components:      Result Value   Glucose, Bld 114 (*)    All other components within normal limits  CBC - Abnormal; Notable for the following components:   Hemoglobin 12.3 (*)    MCV 73.6 (*)    MCH 23.2 (*)    All other components within normal limits  TROPONIN I (HIGH SENSITIVITY)  TROPONIN I (HIGH SENSITIVITY)    EKG EKG Interpretation  Date/Time:  Thursday Aug 31 2020 20:12:16 EDT Ventricular Rate:  95 PR Interval:  142 QRS Duration: 82 QT Interval:  342 QTC Calculation: 429 R Axis:   55 Text Interpretation: Normal sinus rhythm Cannot rule out Inferior infarct , age undetermined Abnormal ECG No significant change since last tracing Confirmed by Gilda Crease [phone]) on 08/31/2020 10:54:21 PM   Radiology DG Chest 2 View  Result Date: 08/31/2020 CLINICAL DATA:  Mid sternal chest pain for 2 days EXAM: CHEST - 2  VIEW COMPARISON:  None FINDINGS: Upper normal size of cardiac silhouette. Mediastinal contours and pulmonary vascularity normal. Lungs clear. No infiltrate, pleural effusion, or pneumothorax. Osseous structures unremarkable. IMPRESSION: No acute abnormalities. Electronically Signed   By: Ulyses Southward M.D.   On: 08/31/2020 20:59  Procedures Procedures   Medications Ordered in ED Medications - No data to display  ED Course  I have reviewed the triage vital signs and the nursing notes.  Pertinent labs & imaging results that were available during my care of the patient were reviewed by me and considered in my medical decision making (see chart for details).    MDM Rules/Calculators/A&P                          Patient presents to the emergency department for evaluation of chest pain.  Patient with somewhat atypical, nonexertional chest pain that has been going on intermittently for several days.  Patient with obesity and well-controlled hypertension.  No history of high cholesterol.  No first-degree relatives with heart disease.  No diabetes or smoking history.  Patient felt to be low risk category for acute coronary syndrome.  EKG unchanged from previous.  Troponin negative x2.  According to current cardiac algorithm, patient felt to be safe for discharge with outpatient follow-up for further testing.  Will refer to cardiology.  Final Clinical Impression(s) / ED Diagnoses Final diagnoses:  Chest pain, unspecified type    Rx / DC Orders ED Discharge Orders    None       Kristi Norment, Canary Brim, MD 09/01/20 0126

## 2020-08-31 NOTE — ED Triage Notes (Signed)
C/o mid sternal chest pain x 2 days , denies SOb

## 2020-09-01 LAB — TROPONIN I (HIGH SENSITIVITY): Troponin I (High Sensitivity): 4 ng/L (ref <18)

## 2020-11-03 DIAGNOSIS — F341 Dysthymic disorder: Secondary | ICD-10-CM | POA: Diagnosis not present

## 2020-11-27 DIAGNOSIS — F341 Dysthymic disorder: Secondary | ICD-10-CM | POA: Diagnosis not present

## 2020-12-08 DIAGNOSIS — F341 Dysthymic disorder: Secondary | ICD-10-CM | POA: Diagnosis not present

## 2021-01-29 DIAGNOSIS — F341 Dysthymic disorder: Secondary | ICD-10-CM | POA: Diagnosis not present

## 2021-03-30 DIAGNOSIS — F341 Dysthymic disorder: Secondary | ICD-10-CM | POA: Diagnosis not present

## 2021-04-04 ENCOUNTER — Ambulatory Visit: Payer: BC Managed Care – PPO | Admitting: Medical

## 2021-04-05 ENCOUNTER — Ambulatory Visit: Payer: BC Managed Care – PPO | Admitting: Medical

## 2021-04-16 ENCOUNTER — Ambulatory Visit (INDEPENDENT_AMBULATORY_CARE_PROVIDER_SITE_OTHER): Payer: BC Managed Care – PPO | Admitting: Medical

## 2021-04-16 ENCOUNTER — Encounter: Payer: Self-pay | Admitting: Medical

## 2021-04-16 ENCOUNTER — Other Ambulatory Visit: Payer: Self-pay

## 2021-04-16 VITALS — BP 140/100 | HR 84 | Temp 97.0°F | Resp 20 | Wt 394.6 lb

## 2021-04-16 DIAGNOSIS — R0789 Other chest pain: Secondary | ICD-10-CM | POA: Diagnosis not present

## 2021-04-16 DIAGNOSIS — R0602 Shortness of breath: Secondary | ICD-10-CM | POA: Insufficient documentation

## 2021-04-16 DIAGNOSIS — I10 Essential (primary) hypertension: Secondary | ICD-10-CM | POA: Diagnosis not present

## 2021-04-16 DIAGNOSIS — R0609 Other forms of dyspnea: Secondary | ICD-10-CM | POA: Insufficient documentation

## 2021-04-16 DIAGNOSIS — Z6841 Body Mass Index (BMI) 40.0 and over, adult: Secondary | ICD-10-CM

## 2021-04-16 DIAGNOSIS — M7989 Other specified soft tissue disorders: Secondary | ICD-10-CM | POA: Diagnosis not present

## 2021-04-16 DIAGNOSIS — G4733 Obstructive sleep apnea (adult) (pediatric): Secondary | ICD-10-CM

## 2021-04-16 DIAGNOSIS — R002 Palpitations: Secondary | ICD-10-CM | POA: Diagnosis not present

## 2021-04-16 MED ORDER — VALSARTAN-HYDROCHLOROTHIAZIDE 80-12.5 MG PO TABS: 1.0000 | ORAL_TABLET | Freq: Every day | ORAL | 2 refills | Status: DC

## 2021-04-16 NOTE — Progress Notes (Signed)
Pt advised.

## 2021-04-16 NOTE — Progress Notes (Signed)
Subjective:  Chris Carlson is a 35 y.o. male who presents for Chief Complaint  Patient presents with   Shortness of Breath    Shortness of breath x 2 months, cirulation issues- hands and feet, chest pain, can start beating faster and has to cough to calm it down- having some depression issues abnormal PQ-9, sometimes he feels like hes having anxiety attack     He notes lately having problems breathing.  For past 2 months gets intermittent SOB.   Most of the time associated with activity.  Has had some leg swelling particularly in right leg and both feet.  Gets swelling in hands as well.  Gets SOB often.  No hx/o lung disease or asthma.     No tobacco, alcohol or drug use.  Tips of fingers can feel numb bilat.  This is intermittent.  No leg numbness.  This has been ongoing for months.    Gets some fast beats of heart periodically.  Sporadic but frequent.   Occasionally gets lightheaded.   Went to emergency dept in 08/2020 for similar symptoms, had evaluation  He is compliant with atenolol daily.   Checks BPs at home.  Systolic BPs at home 140-160 regularly.  Diastolic BPs at home often high 90s.    Does add salt to food.   Sometimes too much fast food.    Sometimes gets a sharp pain in chest lasting a few minutes.   Went to ED for this in 08/2020.  Can feel SOB with the chest pain but no sweats, no light headed, and occasionally has dizziness with the symptoms.    He feels like the symptoms are making him anxious.   Denies a lot of stressors.     Lives at home with wife and father-in-law.   His mother in law who was living with them passed of cancer a few months ago.  Did home sleep study 05/2020 that showed some drop in oxygen and mild OSA.   At the time he couldn't get CPAP due to national wide back order on supplies.  No other aggravating or relieving factors.    No other c/o.  Past Medical History:  Diagnosis Date   Chronic headache    since childhood, no prior neurology consult    Depression    Obesity    Current Outpatient Medications on File Prior to Visit  Medication Sig Dispense Refill   atenolol (TENORMIN) 50 MG tablet Take 1 tablet (50 mg total) by mouth daily. 90 tablet 3   No current facility-administered medications on file prior to visit.   Family History  Problem Relation Age of Onset   Anemia Mother    Hypertension Maternal Grandmother    Diabetes Maternal Grandmother    Cancer Maternal Grandmother        breast   Cancer Maternal Grandfather        prostate   Diabetes Paternal Grandmother    Hypertension Paternal Grandmother    Cancer Paternal Grandfather        prostate   Heart disease Paternal Grandfather        MI   Stroke Neg Hx    Past Surgical History:  Procedure Laterality Date   ANKLE SURGERY     right    The following portions of the patient's history were reviewed and updated as appropriate: allergies, current medications, past family history, past medical history, past social history, past surgical history and problem list.  ROS Otherwise as in subjective above  Objective: BP (!) 140/100    Pulse 84    Temp (!) 97 F (36.1 C)    Resp 20    Wt (!) 394 lb 9.6 oz (179 kg)    SpO2 98%    BMI 63.69 kg/m   BP Readings from Last 3 Encounters:  04/16/21 (!) 140/100  09/01/20 117/76  07/26/20 (!) 130/94   Wt Readings from Last 3 Encounters:  04/16/21 (!) 394 lb 9.6 oz (179 kg)  08/31/20 (!) 400 lb (181.4 kg)  07/26/20 (!) 400 lb 9.6 oz (181.7 kg)    General appearance: alert, no distress, well developed, well nourished Neck: supple, no lymphadenopathy, no thyromegaly, no masses, no JVD Heart: RRR, normal S1, S2, no murmurs Lungs: due to body habitus, decreased breath sounds somewhat, but still audible breath sounds throughout, no wheezes, rhonchi, or rales Pulses: 2+ radial pulses, 1+ pedal pulses, normal cap refill Ext: 1+ bilat LE edema, no palpable cord, negative homans, no obvious UE edema Darker bilat lower leg  skin discoloration, dry and somewhat rough skin c/w venous stasis disuse of both LE   EKG Indication chest discomfort, palpitations, DOE, rate 82 bpm, PR 154 ms, QRS 78 ms, QTC 432 ms, axis 41 degrees, normal sinus rhythm    Assessment: Encounter Diagnoses  Name Primary?   DOE (dyspnea on exertion) Yes   Palpitation    SOB (shortness of breath)    Chest discomfort    Leg swelling    BMI 60.0-69.9, adult (HCC)    Essential hypertension, benign    OSA (obstructive sleep apnea)      Plan: We discussed symptoms, possible differential. If any worsening of symptoms in the next 48 hours then call 911 Urgent referral to cardiology given the worsening symptoms over the past week and a half Labs today Continue atenolol but add valsartan HCT Reviewed EKG today Home blood pressure readings have not been at goal, his symptoms have persisted for at least 2 weeks intermittent he does have prior diagnosed sleep apnea with hypoxia but was unable to get a CPAP due to national backorder earlier this year.  This needs to be reconsidered at this time He has severe obesity.  Pending cardiology consult, consider bariatric clinic consult or other measures to help lose weight   Jacody was seen today for shortness of breath.  Diagnoses and all orders for this visit:  DOE (dyspnea on exertion) -     Basic metabolic panel -     Brain natriuretic peptide -     TSH -     CBC -     EKG 12-Lead -     Ambulatory referral to Cardiology -     Spirometry with graph  Palpitation -     EKG 12-Lead -     Ambulatory referral to Cardiology  SOB (shortness of breath) -     EKG 12-Lead -     Ambulatory referral to Cardiology -     Spirometry with graph  Chest discomfort -     Basic metabolic panel -     Brain natriuretic peptide -     EKG 12-Lead -     Ambulatory referral to Cardiology  Leg swelling -     Basic metabolic panel -     Brain natriuretic peptide -     TSH -     CBC -     EKG  12-Lead -     Ambulatory referral to Cardiology  BMI 60.0-69.9, adult (  HCC) -     EKG 12-Lead -     Ambulatory referral to Cardiology  Essential hypertension, benign -     Basic metabolic panel -     Brain natriuretic peptide -     EKG 12-Lead -     Ambulatory referral to Cardiology  OSA (obstructive sleep apnea) -     EKG 12-Lead -     Ambulatory referral to Cardiology  Other orders -     valsartan-hydrochlorothiazide (DIOVAN-HCT) 80-12.5 MG tablet; Take 1 tablet by mouth daily.    Follow up: pending labs, referral

## 2021-04-17 ENCOUNTER — Other Ambulatory Visit: Payer: Self-pay | Admitting: Internal Medicine

## 2021-04-17 DIAGNOSIS — Z1211 Encounter for screening for malignant neoplasm of colon: Secondary | ICD-10-CM

## 2021-04-17 DIAGNOSIS — D649 Anemia, unspecified: Secondary | ICD-10-CM

## 2021-04-17 LAB — TSH: TSH: 1.78 u[IU]/mL (ref 0.450–4.500)

## 2021-04-17 LAB — BASIC METABOLIC PANEL
BUN/Creatinine Ratio: 10 (ref 9–20)
BUN: 10 mg/dL (ref 6–20)
CO2: 24 mmol/L (ref 20–29)
Calcium: 9.3 mg/dL (ref 8.7–10.2)
Chloride: 102 mmol/L (ref 96–106)
Creatinine, Ser: 1.04 mg/dL (ref 0.76–1.27)
Glucose: 87 mg/dL (ref 70–99)
Potassium: 4.5 mmol/L (ref 3.5–5.2)
Sodium: 138 mmol/L (ref 134–144)
eGFR: 95 mL/min/{1.73_m2} (ref >59)

## 2021-04-17 LAB — CBC
Hematocrit: 40.3 % (ref 37.5–51.0)
Hemoglobin: 12.7 g/dL — ABNORMAL LOW (ref 13.0–17.7)
MCH: 23 pg — ABNORMAL LOW (ref 26.6–33.0)
MCHC: 31.5 g/dL (ref 31.5–35.7)
MCV: 73 fL — ABNORMAL LOW (ref 79–97)
Platelets: 316 10*3/uL (ref 150–450)
RBC: 5.53 x10E6/uL (ref 4.14–5.80)
RDW: 14.8 % (ref 11.6–15.4)
WBC: 6.2 10*3/uL (ref 3.4–10.8)

## 2021-04-17 LAB — BRAIN NATRIURETIC PEPTIDE: BNP: 10.7 pg/mL (ref 0.0–100.0)

## 2021-04-17 NOTE — Progress Notes (Signed)
Cardiology Office Note   Date:  04/18/2021   ID:  Cipriano, Millikan [birth year], MRN [id number]  PCP:  Jac Canavan, PA-C  Cardiologist:   None Referring:  Aleen Campi Kermit Balo, PA-C  Chief Complaint  Patient presents with   Palpitations      History of Present Illness: Chris Carlson is a 35 y.o. male who presents for evaluation of chest pain.  He was referred by Jac Canavan, PA-C .  He was in the ED in May for this.  I reviewed these records.  This was thought to be nonanginal and they were no enzyme elevations or KG changes.  He described a couple of different sensations.  1 is chest discomfort that happens 1-2 times per month.  He is made sternal.  It is sharp.  He gets dizzy.  It might last for minutes.  It comes on spontaneously and goes away spontaneously.  He he does not notice it with exertion and there are no associated symptoms.  He also has palpitations.  It takes his breath away and he has to cough.  This can happen at rest.  It might happen when he walks.  This again last for several minutes.  He does not have any presyncope or syncope.  He does not describe any PND or orthopnea.  He has never had any cardiac work-up.      Past Medical History:  Diagnosis Date   Chronic headache    since childhood, no prior neurology consult   Depression    Obesity     Past Surgical History:  Procedure Laterality Date   ANKLE SURGERY     right     Current Outpatient Medications  Medication Sig Dispense Refill   atenolol (TENORMIN) 50 MG tablet Take 1 tablet (50 mg total) by mouth daily. 90 tablet 3   valsartan-hydrochlorothiazide (DIOVAN-HCT) 80-12.5 MG tablet Take 1 tablet by mouth daily. 30 tablet 2   No current facility-administered medications for this visit.    Allergies:   Patient has no known allergies.    Social History:  The patient  reports that he has never smoked. He has never used smokeless tobacco. He reports that he does not drink alcohol and  does not use drugs.   Family History:  The patient's family history includes Anemia in his mother; Cancer in his maternal grandfather, maternal grandmother, and paternal grandfather; Diabetes in his maternal grandmother and paternal grandmother; Heart disease in his paternal grandfather; Hypertension in his maternal grandmother and paternal grandmother.    ROS:  Please see the history of present illness.   Otherwise, review of systems are positive for none.   All other systems are reviewed and negative.    PHYSICAL EXAM: VS:  BP 126/78 (BP Location: Left Arm, Patient Position: Sitting, Cuff Size: Large)    Pulse 72    Resp 20    Ht 5\' 6"  (1.676 m)    Wt (!) 391 lb 3.2 oz (177.4 kg)    SpO2 98%    BMI 63.14 kg/m  , BMI Body mass index is 63.14 kg/m. GENERAL:  Well appearing HEENT:  Pupils equal round and reactive, fundi not visualized, oral mucosa unremarkable NECK:  No jugular venous distention, waveform within normal limits, carotid upstroke brisk and symmetric, no bruits, no thyromegaly LYMPHATICS:  No cervical, inguinal adenopathy LUNGS:  Clear to auscultation bilaterally BACK:  No CVA tenderness CHEST:  Unremarkable HEART:  PMI not displaced or sustained,S1 and S2 within  normal limits, no S3, no S4, no clicks, no rubs, no murmurs ABD:  Flat, positive bowel sounds normal in frequency in pitch, no bruits, no rebound, no guarding, no midline pulsatile mass, no hepatomegaly, no splenomegaly EXT:  2 plus pulses throughout, no edema, no cyanosis no clubbing SKIN:  No rashes no nodules NEURO:  Cranial nerves II through XII grossly intact, motor grossly intact throughout PSYCH:  Cognitively intact, oriented to person place and time    EKG:  EKG is not ordered today. The ekg ordered 04/17/2021 demonstrates sinus rhythm, rate 82, axis within normal limits, intervals within normal limits, no acute ST-T wave changes.   Recent Labs: 07/26/2020: ALT 23 04/16/2021: BNP 10.7; BUN 10; Creatinine,  Ser 1.04; Hemoglobin 12.7; Platelets 316; Potassium 4.5; Sodium 138; TSH 1.780    Lipid Panel    Component Value Date/Time   CHOL 257 (H) 07/26/2020 1421   TRIG 124 07/26/2020 1421   HDL 41 07/26/2020 1421   CHOLHDL 6.3 (H) 07/26/2020 1421   LDLCALC 193 (H) 07/26/2020 1421      Wt Readings from Last 3 Encounters:  04/18/21 (!) 391 lb 3.2 oz (177.4 kg)  04/16/21 (!) 394 lb 9.6 oz (179 kg)  08/31/20 (!) 400 lb (181.4 kg)      Other studies Reviewed: Additional studies/ records that were reviewed today include: ED records. Review of the above records demonstrates:  Please see elsewhere in the note.     ASSESSMENT AND PLAN:  CHEST PAIN: Chest discomfort sounds nonanginal.  I am going to start with a coronary calcium score.  He does have risk factors.  Further testing will be based on this.  PALPITATIONS: I am going to apply a 4-week Zio patch.  He does have normal electrolytes and TSH recently.  Further management will be based on these.  DYSLIPIDEMIA: His LDL was 193 earlier this year.  We talked about diet.  I like to follow this in 3 months with a lipid profile after he is employed this diet.  Goals of therapy will be based on the calcium score and his future readings.  MORBID OBESITY: He has been on referred for a weight loss program.   Current medicines are reviewed at length with the patient today.  The patient does not have concerns regarding medicines.  The following changes have been made:  no change  Labs/ tests ordered today include:   Orders Placed This Encounter  Procedures   CT CARDIAC SCORING (SELF PAY ONLY)   Cardiac event monitor     Disposition:   FU with me in about six weeks.      Signed, Rollene Rotunda, MD  04/18/2021 11:52 AM    [facility]

## 2021-04-18 ENCOUNTER — Ambulatory Visit (INDEPENDENT_AMBULATORY_CARE_PROVIDER_SITE_OTHER): Payer: BC Managed Care – PPO | Admitting: Cardiology

## 2021-04-18 ENCOUNTER — Encounter: Payer: Self-pay | Admitting: Cardiology

## 2021-04-18 ENCOUNTER — Other Ambulatory Visit: Payer: Self-pay

## 2021-04-18 VITALS — BP 126/78 | HR 72 | Resp 20 | Ht 66.0 in | Wt 391.2 lb

## 2021-04-18 DIAGNOSIS — R072 Precordial pain: Secondary | ICD-10-CM | POA: Diagnosis not present

## 2021-04-18 DIAGNOSIS — Z1211 Encounter for screening for malignant neoplasm of colon: Secondary | ICD-10-CM | POA: Diagnosis not present

## 2021-04-18 DIAGNOSIS — R002 Palpitations: Secondary | ICD-10-CM | POA: Diagnosis not present

## 2021-04-18 NOTE — Patient Instructions (Signed)
°  Testing/Procedures:  CALCIUM SCORING CT SCAN AT [address]  Your physician has recommended that you wear an event monitor. Event monitors are medical devices that record the hearts electrical activity. Doctors most often [place] these monitors to diagnose arrhythmias. Arrhythmias are problems with the speed or rhythm of the heartbeat. The monitor is a small, portable device. You can wear one while you do your normal daily activities. This is usually used to diagnose what is causing palpitations/syncope (passing out). WILL BE MAILED TO YOUR HOME   Follow-Up: At [facility], you and your health needs are our priority.  As part of our continuing mission to provide you with exceptional heart care, we have created designated Provider Care Teams.  These Care Teams include your primary Cardiologist (physician) and Advanced Practice Providers (APPs -  Physician Assistants and Nurse Practitioners) who all work together to provide you with the care you need, when you need it.  We recommend signing up for the patient portal called "MyChart".  Sign up information is provided on this After Visit Summary.  MyChart is used to connect with patients for Virtual Visits (Telemedicine).  Patients are able to view lab/test results, encounter notes, upcoming appointments, etc.  Non-urgent messages can be sent to your provider as well.   To learn more about what you can do with MyChart, go to [email or web address].    Your next appointment:   6 week(s)  The format for your next appointment:   In Person  Provider:   Rollene Rotunda MD

## 2021-04-25 ENCOUNTER — Ambulatory Visit (INDEPENDENT_AMBULATORY_CARE_PROVIDER_SITE_OTHER): Payer: BC Managed Care – PPO

## 2021-04-25 DIAGNOSIS — R002 Palpitations: Secondary | ICD-10-CM

## 2021-04-25 LAB — FECAL OCCULT BLOOD, IMMUNOCHEMICAL: Fecal Occult Bld: NEGATIVE

## 2021-04-26 DIAGNOSIS — R002 Palpitations: Secondary | ICD-10-CM | POA: Diagnosis not present

## 2021-05-08 ENCOUNTER — Other Ambulatory Visit: Payer: Self-pay | Admitting: Medical

## 2021-05-16 ENCOUNTER — Encounter: Payer: Self-pay | Admitting: Internal Medicine

## 2021-05-24 ENCOUNTER — Ambulatory Visit (INDEPENDENT_AMBULATORY_CARE_PROVIDER_SITE_OTHER)
Admission: RE | Admit: 2021-05-24 | Discharge: 2021-05-24 | Disposition: A | Discharge status: Home / Self Care | Payer: Self-pay | Source: Ambulatory Visit | Attending: Cardiology | Admitting: Cardiology

## 2021-05-24 ENCOUNTER — Other Ambulatory Visit: Payer: Self-pay

## 2021-05-24 DIAGNOSIS — R072 Precordial pain: Secondary | ICD-10-CM

## 2021-06-07 DIAGNOSIS — E785 Hyperlipidemia, unspecified: Secondary | ICD-10-CM | POA: Insufficient documentation

## 2021-06-07 DIAGNOSIS — R072 Precordial pain: Secondary | ICD-10-CM | POA: Insufficient documentation

## 2021-06-07 NOTE — Progress Notes (Signed)
Cardiology Office Note   Date:  06/08/2021   ID:  Chris Carlson 1986-04-02, MRN [id number]  PCP:  Jac Canavan, PA-C  Cardiologist:   None Referring:  Jac Canavan, PA-C   No chief complaint on file.     History of Present Illness: Chris Carlson is a 36 y.o. male who presents for evaluation of chest pain.  After the last visit he had a coronary calcium score of zero.  He had no arrhythmias on a monitor.  Since I last saw him he is otherwise been doing relatively well.  He denies any chest pressure, neck or arm discomfort.  He is not having any new palpitations, presyncope or syncope.  He has had some weight loss.  He stopped drinking sugared sodas.  He thinks his palpitations are a little bit less frequent than previous.   Past Medical History:  Diagnosis Date   Chronic headache    since childhood, no prior neurology consult   Depression    Obesity     Past Surgical History:  Procedure Laterality Date   ANKLE SURGERY     right     Current Outpatient Medications  Medication Sig Dispense Refill   atenolol (TENORMIN) 50 MG tablet Take 1 tablet (50 mg total) by mouth daily. 90 tablet 3   valsartan-hydrochlorothiazide (DIOVAN-HCT) 80-12.5 MG tablet TAKE 1 TABLET BY MOUTH EVERY DAY 30 tablet 2   No current facility-administered medications for this visit.    Allergies:   Patient has no known allergies.    ROS:  Please see the history of present illness.   Otherwise, review of systems are positive for none.   All other systems are reviewed and negative.    PHYSICAL EXAM: VS:  BP 125/72    Pulse 72    Ht 5\' 6"  (1.676 m)    Wt (!) 388 lb (176 kg)    SpO2 98%    BMI 62.62 kg/m  , BMI Body mass index is 62.62 kg/m. GENERAL:  Well appearing NECK:  No jugular venous distention, waveform within normal limits, carotid upstroke brisk and symmetric, no bruits, no thyromegaly LUNGS:  Clear to auscultation bilaterally CHEST:  Unremarkable HEART:  PMI not  displaced or sustained,S1 and S2 within normal limits, no S3, no S4, no clicks, no rubs, no murmurs ABD:  Flat, positive bowel sounds normal in frequency in pitch, no bruits, no rebound, no guarding, no midline pulsatile mass, no hepatomegaly, no splenomegaly EXT:  2 plus pulses throughout, no edema, no cyanosis no clubbing    EKG:  EKG is not  ordered today.    Recent Labs: 07/26/2020: ALT 23 04/16/2021: BNP 10.7; BUN 10; Creatinine, Ser 1.04; Hemoglobin 12.7; Platelets 316; Potassium 4.5; Sodium 138; TSH 1.780    Lipid Panel    Component Value Date/Time   CHOL 257 (H) 07/26/2020 1421   TRIG 124 07/26/2020 1421   HDL 41 07/26/2020 1421   CHOLHDL 6.3 (H) 07/26/2020 1421   LDLCALC 193 (H) 07/26/2020 1421      Wt Readings from Last 3 Encounters:  06/08/21 (!) 388 lb (176 kg)  04/18/21 (!) 391 lb 3.2 oz (177.4 kg)  04/16/21 (!) 394 lb 9.6 oz (179 kg)      Other studies Reviewed: Additional studies/ records that were reviewed today include: None Review of the above records demonstrates:  NA   ASSESSMENT AND PLAN:  CHEST PAIN:   Patient is having no further chest discomfort.  No further  work-up is suggested.  Calcium score was 0  PALPITATIONS: He had no significant dysrhythmias.  For now we will continue current beta-blocker.  No change in therapy.  DYSLIPIDEMIA: His LDL was 193 but now has been dieting and avoiding red meats.  I will check another lipid profile and goals of therapy can be determined based on this reading.  Again he has a 0 calcium score.   MORBID OBESITY:   I am proud of his weight loss and we talked at length about further diet changes.   Current medicines are reviewed at length with the patient today.  The patient does not have concerns regarding medicines.  The following changes have been made: Non  Labs/ tests ordered today include:   Orders Placed This Encounter  Procedures   Lipid panel     Disposition:   FU with me in 12  months   Signed, Rollene Rotunda, MD  06/08/2021 1:05 PM    [facility]

## 2021-06-08 ENCOUNTER — Other Ambulatory Visit: Payer: Self-pay

## 2021-06-08 ENCOUNTER — Encounter: Payer: Self-pay | Admitting: Cardiology

## 2021-06-08 ENCOUNTER — Ambulatory Visit (INDEPENDENT_AMBULATORY_CARE_PROVIDER_SITE_OTHER): Payer: BC Managed Care – PPO | Admitting: Cardiology

## 2021-06-08 VITALS — BP 125/72 | HR 72 | Ht 66.0 in | Wt 388.0 lb

## 2021-06-08 DIAGNOSIS — R002 Palpitations: Secondary | ICD-10-CM | POA: Diagnosis not present

## 2021-06-08 DIAGNOSIS — E785 Hyperlipidemia, unspecified: Secondary | ICD-10-CM | POA: Diagnosis not present

## 2021-06-08 DIAGNOSIS — R072 Precordial pain: Secondary | ICD-10-CM

## 2021-06-08 NOTE — Patient Instructions (Signed)
Medication Instructions:  The current medical regimen is effective;  continue present plan and medications.  *If you need a refill on your cardiac medications before your next appointment, please call your pharmacy*   Lab Work: LIPID (come back fasting, no lab appointment needed)  If you have labs (blood work) drawn today and your tests are completely normal, you will receive your results only by: MyChart Message (if you have MyChart) OR A paper copy in the mail If you have any lab test that is abnormal or we need to change your treatment, we will call you to review the results.  Follow-Up: At [facility], you and your health needs are our priority.  As part of our continuing mission to provide you with exceptional heart care, we have created designated Provider Care Teams.  These Care Teams include your primary Cardiologist (physician) and Advanced Practice Providers (APPs -  Physician Assistants and Nurse Practitioners) who all work together to provide you with the care you need, when you need it.  We recommend signing up for the patient portal called "MyChart".  Sign up information is provided on this After Visit Summary.  MyChart is used to connect with patients for Virtual Visits (Telemedicine).  Patients are able to view lab/test results, encounter notes, upcoming appointments, etc.  Non-urgent messages can be sent to your provider as well.   To learn more about what you can do with MyChart, go to [email or web address].    Your next appointment:   12 month(s)  The format for your next appointment:   In Person  Provider:   Rollene Rotunda, MD

## 2021-06-12 DIAGNOSIS — E785 Hyperlipidemia, unspecified: Secondary | ICD-10-CM | POA: Diagnosis not present

## 2021-06-12 LAB — LIPID PANEL
Chol/HDL Ratio: 5.9 ratio — ABNORMAL HIGH (ref 0.0–5.0)
Cholesterol, Total: 235 mg/dL — ABNORMAL HIGH (ref 100–199)
HDL: 40 mg/dL (ref >39)
LDL Chol Calc (NIH): 173 mg/dL — ABNORMAL HIGH (ref 0–99)
Triglycerides: 120 mg/dL (ref 0–149)
VLDL Cholesterol Cal: 22 mg/dL (ref 5–40)

## 2021-06-15 ENCOUNTER — Other Ambulatory Visit: Payer: Self-pay | Admitting: Medical

## 2021-06-15 ENCOUNTER — Telehealth: Payer: Self-pay | Admitting: Cardiology

## 2021-06-15 ENCOUNTER — Other Ambulatory Visit: Payer: Self-pay

## 2021-06-15 MED ORDER — ATORVASTATIN CALCIUM 40 MG PO TABS: 40.0000 mg | ORAL_TABLET | Freq: Every day | ORAL | 3 refills | Status: DC

## 2021-06-15 NOTE — Telephone Encounter (Signed)
Patient returning call for lab results. 

## 2021-06-15 NOTE — Telephone Encounter (Signed)
Gave patient lipid blood work. We discussed dietary measures to help reduce cholesterol. Informed patient that a prescription for liptor 40 mg each day was sent to his pharmacy. Results forwarded to Crosby Oyster, PA-C

## 2021-07-27 ENCOUNTER — Encounter: Payer: BC Managed Care – PPO | Admitting: Medical

## 2021-08-05 IMAGING — DX DG CHEST 2V
2 series · 2 of 2 positions shown · non-contrast
Comparison: None

CLINICAL DATA: Mid sternal chest pain for 2 days

EXAM:
CHEST - 2 VIEW

[chest pa]
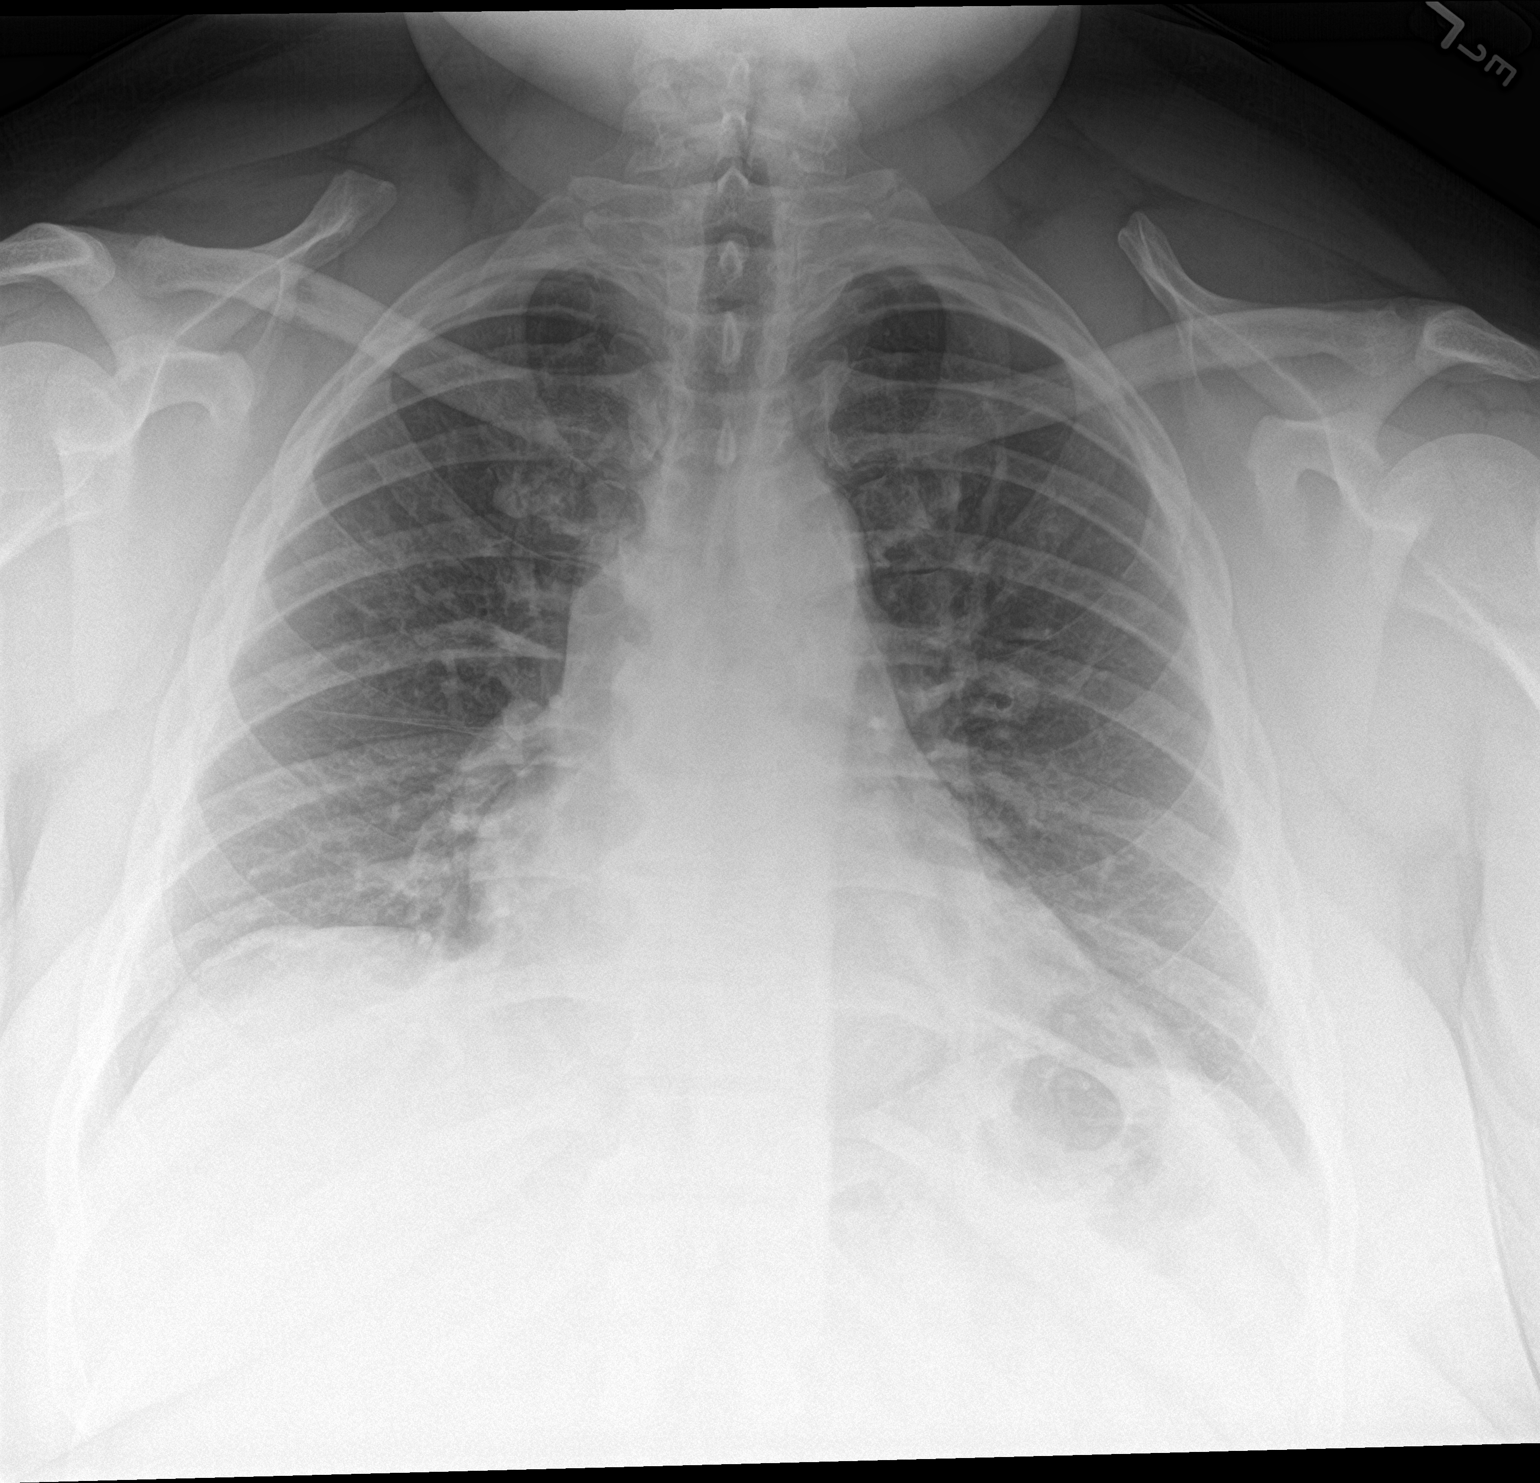

[chest lat]
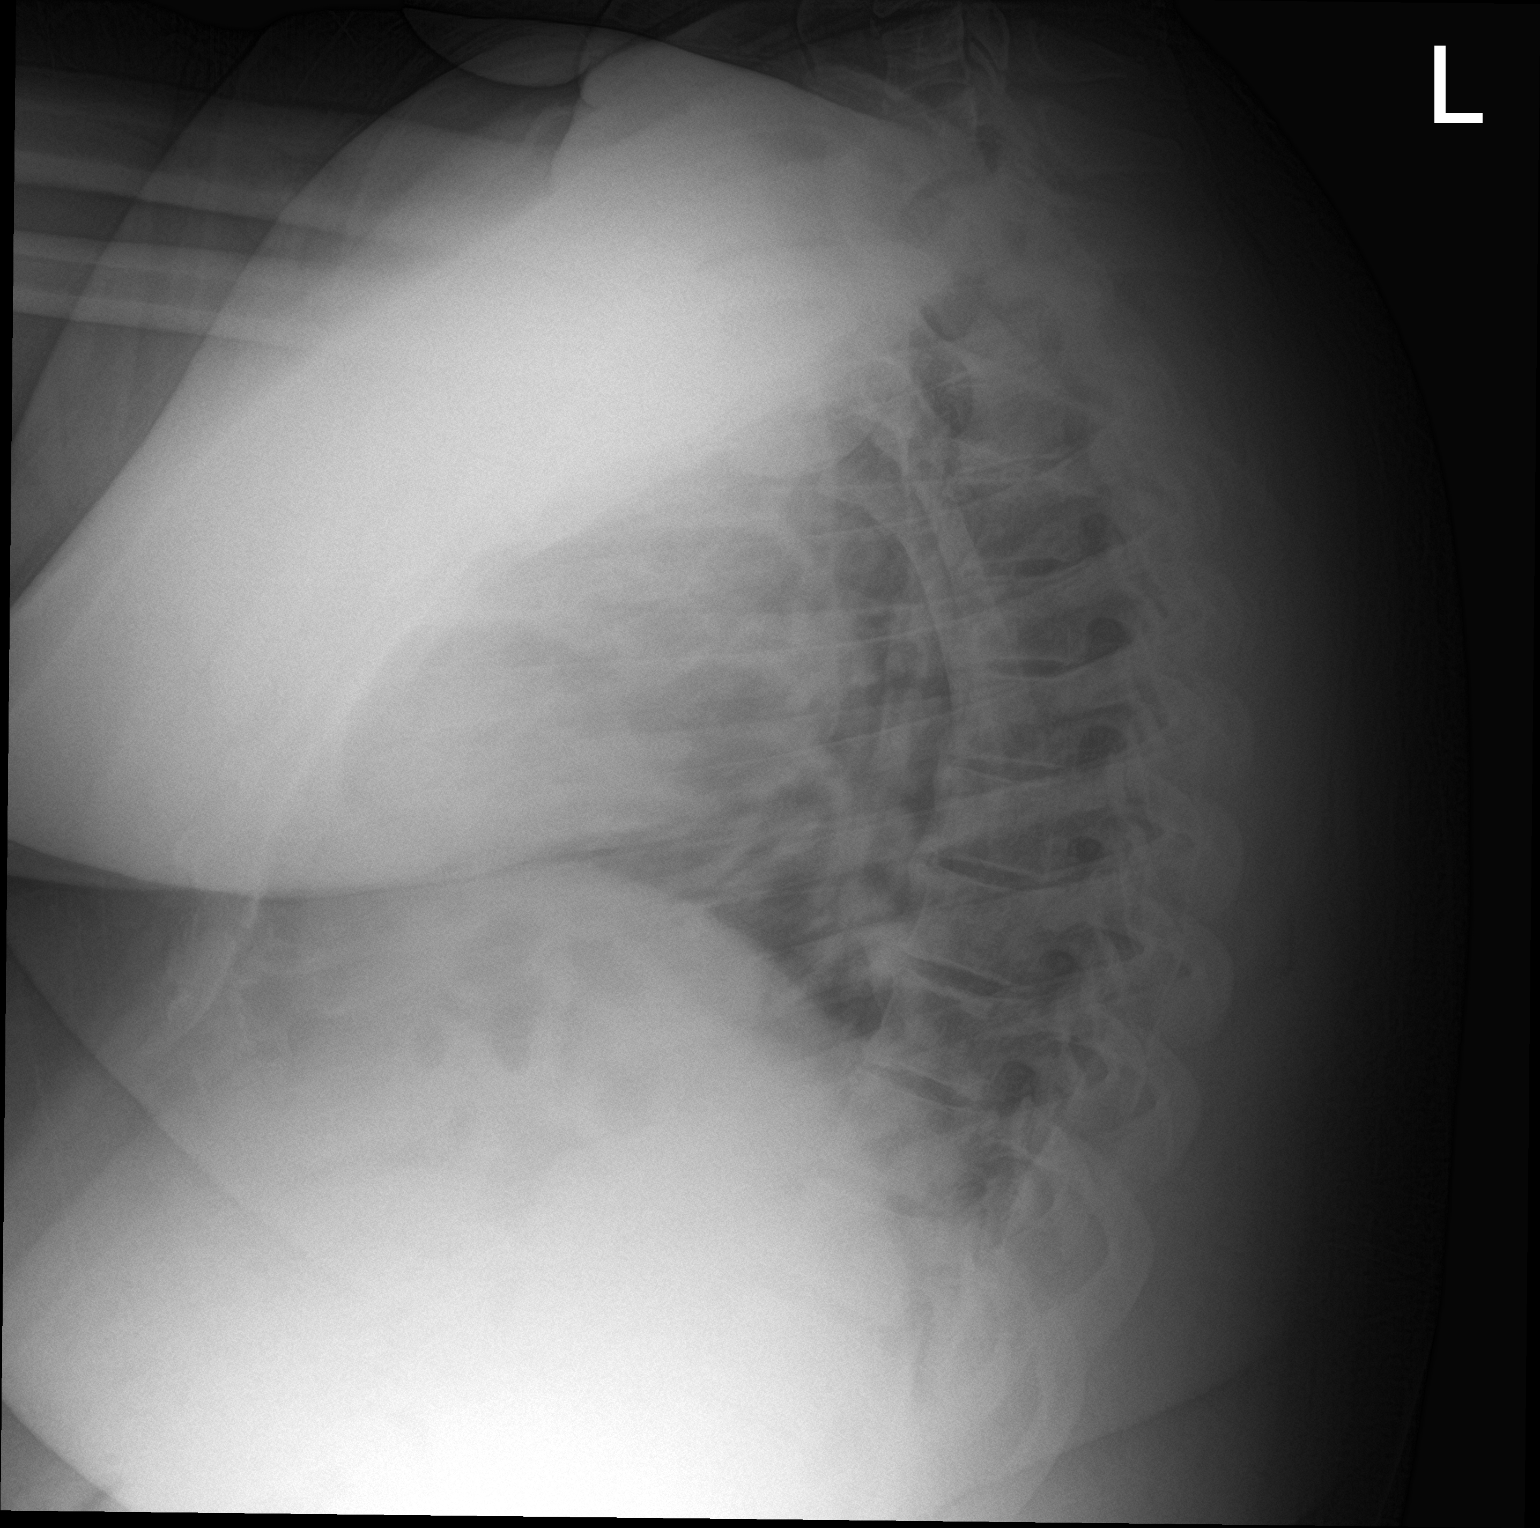

[2 of 2 positions shown; findings below may reference images not displayed]

FINDINGS: Upper normal size of cardiac silhouette.

Mediastinal contours and pulmonary vascularity normal.

Lungs clear.

No infiltrate, pleural effusion, or pneumothorax.

Osseous structures unremarkable.
IMPRESSION: No acute abnormalities.

## 2021-10-01 ENCOUNTER — Emergency Department (HOSPITAL_BASED_OUTPATIENT_CLINIC_OR_DEPARTMENT_OTHER): Payer: BC Managed Care – PPO

## 2021-10-01 ENCOUNTER — Emergency Department (HOSPITAL_BASED_OUTPATIENT_CLINIC_OR_DEPARTMENT_OTHER)
Admission: EM | Admit: 2021-10-01 | Discharge: 2021-10-01 | Disposition: A | Discharge status: Home / Self Care | Payer: BC Managed Care – PPO | Attending: Student | Admitting: Student

## 2021-10-01 ENCOUNTER — Encounter (HOSPITAL_BASED_OUTPATIENT_CLINIC_OR_DEPARTMENT_OTHER): Payer: Self-pay

## 2021-10-01 ENCOUNTER — Other Ambulatory Visit: Payer: Self-pay

## 2021-10-01 DIAGNOSIS — Z79899 Other long term (current) drug therapy: Secondary | ICD-10-CM | POA: Diagnosis not present

## 2021-10-01 DIAGNOSIS — Y9301 Activity, walking, marching and hiking: Secondary | ICD-10-CM | POA: Diagnosis not present

## 2021-10-01 DIAGNOSIS — W109XXA Fall (on) (from) unspecified stairs and steps, initial encounter: Secondary | ICD-10-CM | POA: Insufficient documentation

## 2021-10-01 DIAGNOSIS — M25572 Pain in left ankle and joints of left foot: Secondary | ICD-10-CM | POA: Diagnosis not present

## 2021-10-01 DIAGNOSIS — I10 Essential (primary) hypertension: Secondary | ICD-10-CM | POA: Diagnosis not present

## 2021-10-01 MED ORDER — NAPROXEN 375 MG PO TABS: 375.0000 mg | ORAL_TABLET | Freq: Two times a day (BID) | ORAL | 0 refills | Status: DC

## 2021-10-01 MED ORDER — NAPROXEN 250 MG PO TABS
ORAL_TABLET | ORAL | Status: AC
  Filled 2021-10-01: qty 2

## 2021-10-01 MED ORDER — NAPROXEN 250 MG PO TABS
500.0000 mg | ORAL_TABLET | Freq: Once | ORAL | Status: AC
  Administered 2021-10-01: 500 mg via ORAL

## 2021-10-01 NOTE — ED Notes (Signed)
AVS provided to client, reinforced pt teaching re: crutch use, ice pack application, splint use, elevation and rest all to aid in pain control. Opportunity for questions provided prior to DC to home

## 2021-10-01 NOTE — ED Triage Notes (Signed)
Pt reports he was walking down steps yesterday injuring left ankle. No loss of consciousness. Unsure what made him fall, reports he fell and then slid down steps

## 2021-10-01 NOTE — ED Provider Notes (Signed)
[facility] EMERGENCY DEPARTMENT Provider Note  CSN: [id number] Arrival date & time: 10/01/21 9449  Chief Complaint(s) Ankle Pain  HPI Chris Carlson is a 36 y.o. male who presents to the emergency department for evaluation of left ankle pain.  Patient states that approximately 24 hours ago he tripped on the stairs and landed on his left side.  No head strike or loss of consciousness.  Patient states that he went home and tried to elevate the leg but his left ankle pain has been persistent and comes to the emergency department due to persistent pain.  He states he is able to bear weight on the ankle but only for short amount of time.  Denies chest pain, shortness of breath, abdominal pain, nausea, vomiting or other systemic or traumatic complaints.  Denies numbness, tingling, weakness of lower extremity.  Pulses intact.   Past Medical History Past Medical History:  Diagnosis Date   Chronic headache    since childhood, no prior neurology consult   Depression    Obesity    Patient Active Problem List   Diagnosis Date Noted   Precordial chest pain 06/07/2021   Dyslipidemia 06/07/2021   DOE (dyspnea on exertion) 04/16/2021   Palpitation 04/16/2021   SOB (shortness of breath) 04/16/2021   Chest discomfort 04/16/2021   Leg swelling 04/16/2021   Encounter for health maintenance examination in adult 07/26/2020   Need for Tdap vaccination 07/26/2020   Screen for STD (sexually transmitted disease) 07/26/2020   Screening for diabetes mellitus 07/26/2020   Screening for lipid disorders 07/26/2020   OSA (obstructive sleep apnea) 07/26/2020   Encounter for hepatitis C screening test for low risk patient 07/26/2020   Chronic nonintractable headache 05/31/2020   Essential hypertension, benign 05/31/2020   Microcytic anemia 05/31/2020   BMI 60.0-69.9, adult (HCC) 05/31/2020   Family history of hypertension 05/31/2020   Family history of prostate cancer 05/31/2020   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 Authorizing Provider  atenolol (TENORMIN) 50 MG tablet Take 1 tablet (50 mg total) by mouth daily. 07/26/20  Yes Tysinger, Kermit Balo, PA-C  valsartan-hydrochlorothiazide (DIOVAN-HCT) 80-12.5 MG tablet TAKE 1 TABLET BY MOUTH EVERY DAY 06/18/21  Yes Tysinger, Kermit Balo, PA-C  atorvastatin (LIPITOR) 40 MG tablet Take 1 tablet (40 mg total) by mouth daily. 06/15/21 09/13/21  Rollene Rotunda, MD                                                                                                                                    Past Surgical History Past Surgical History:  Procedure Laterality Date   ANKLE SURGERY     right   Family History Family History  Problem Relation Age of Onset   Anemia Mother    Hypertension Maternal Grandmother    Diabetes Maternal Grandmother    Cancer Maternal Grandmother  breast   Cancer Maternal Grandfather        prostate   Diabetes Paternal Grandmother    Hypertension Paternal Grandmother    Cancer Paternal Grandfather        prostate   Heart disease Paternal Grandfather        MI   Stroke Neg Hx     Social History Social History   Tobacco Use   Smoking status: Never   Smokeless tobacco: Never  Substance Use Topics   Alcohol use: Never   Drug use: Never   Allergies Patient has no known allergies.  Review of Systems Review of Systems  Musculoskeletal:  Positive for arthralgias and myalgias.   Physical Exam Vital Signs  I have reviewed the triage vital signs Temp 97.6 F (36.4 C) (Oral)   Ht  (1.702 m)   Wt (!) 176.9 kg   BMI 61.08 kg/m   Physical Exam Constitutional:      General: He is not in acute distress.    Appearance: Normal appearance.  HENT:     Head: Normocephalic and atraumatic.     Nose: No congestion or rhinorrhea.  Eyes:     General:        Right eye: No discharge.        Left eye: No discharge.      Extraocular Movements: Extraocular movements intact.     Pupils: Pupils are equal, round, and reactive to light.  Cardiovascular:     Rate and Rhythm: Normal rate and regular rhythm.     Heart sounds: No murmur heard. Pulmonary:     Effort: No respiratory distress.     Breath sounds: No wheezing or rales.  Abdominal:     General: There is no distension.     Tenderness: There is no abdominal tenderness.  Musculoskeletal:        General: Swelling and tenderness present. Normal range of motion.     Cervical back: Normal range of motion.  Skin:    General: Skin is warm and dry.  Neurological:     General: No focal deficit present.     Mental Status: He is alert.    ED Results and Treatments Labs (all labs ordered are listed, but only abnormal results are displayed) Labs Reviewed - No data to display                                                                                                                        Radiology No results found.  Pertinent labs & imaging results that were available during my care of the patient were reviewed by me and considered in my medical decision making (see MDM for details).  Medications Ordered in ED Medications - No data to display  Procedures Procedures  (including critical care time)  Medical Decision Making / ED Course   This patient presents to the ED for concern of left ankle pain, this involves an extensive number of treatment options, and is a complaint that carries with it a high risk of complications and morbidity.  The differential diagnosis includes fracture, ligamentous injury, ankle sprain, hematoma  MDM: Patient seen in the emergency room for evaluation of ankle pain.  Physical exam reveals tenderness to the left ankle and associated swelling but no obvious deformity and pulses are intact.   No additional evidence of trauma on physical exam.  X-ray of the ankle unremarkable.  Patient presentation consistent with ankle sprain and Ace wrap was applied, Aircast applied and patient given crutches.  Patient started on Naprosyn for anti-inflammatories and given instructions on RICE therapy.  Patient will follow-up with his PCP.  Patient then discharged.   Additional history obtained:  -External records from outside source obtained and reviewed including: Chart review including previous notes, labs, imaging, consultation notes   Imaging Studies ordered: I ordered imaging studies including ankle x-ray I independently visualized and interpreted imaging. I agree with the radiologist interpretation   Medicines ordered and prescription drug management: No orders of the defined types were placed in this encounter.   -I have reviewed the patients home medicines and have made adjustments as needed  Critical interventions none    Cardiac Monitoring: The patient was maintained on a cardiac monitor.  I personally viewed and interpreted the cardiac monitored which showed an underlying rhythm of: NSR  Social Determinants of Health:  Factors impacting patients care include: none   Reevaluation: After the interventions noted above, I reevaluated the patient and found that they have :improved  Co morbidities that complicate the patient evaluation  Past Medical History:  Diagnosis Date   Chronic headache    since childhood, no prior neurology consult   Depression    Obesity       Dispostion: I considered admission for this patient, but his ankle sprain does not meet inpatient criteria for admission is safe for discharge with outpatient follow-up     Final Clinical Impression(s) / ED Diagnoses Final diagnoses:  None     @PCDICTATION @    Tahirih Lair, , MD 10/01/21 [phone]

## 2021-10-02 ENCOUNTER — Telehealth: Payer: Self-pay | Admitting: Medical

## 2021-10-02 NOTE — Telephone Encounter (Signed)
Pt called concerning recent ER visit. Pt fell and twisted his ankle. He states that he doesn't think he needs follow up right now. Pt was advised to call if having issues. Pt scheduled a CPE while on the phone.

## 2021-11-15 ENCOUNTER — Ambulatory Visit (INDEPENDENT_AMBULATORY_CARE_PROVIDER_SITE_OTHER): Payer: BC Managed Care – PPO | Admitting: Medical

## 2021-11-15 ENCOUNTER — Encounter: Payer: Self-pay | Admitting: Medical

## 2021-11-15 VITALS — BP 122/80 | HR 92 | Ht 69.0 in | Wt 397.5 lb

## 2021-11-15 DIAGNOSIS — R0602 Shortness of breath: Secondary | ICD-10-CM

## 2021-11-15 DIAGNOSIS — E785 Hyperlipidemia, unspecified: Secondary | ICD-10-CM

## 2021-11-15 DIAGNOSIS — Z13220 Encounter for screening for lipoid disorders: Secondary | ICD-10-CM | POA: Diagnosis not present

## 2021-11-15 DIAGNOSIS — R5383 Other fatigue: Secondary | ICD-10-CM

## 2021-11-15 DIAGNOSIS — G4733 Obstructive sleep apnea (adult) (pediatric): Secondary | ICD-10-CM

## 2021-11-15 DIAGNOSIS — R7301 Impaired fasting glucose: Secondary | ICD-10-CM

## 2021-11-15 DIAGNOSIS — I10 Essential (primary) hypertension: Secondary | ICD-10-CM | POA: Diagnosis not present

## 2021-11-15 DIAGNOSIS — Z6841 Body Mass Index (BMI) 40.0 and over, adult: Secondary | ICD-10-CM

## 2021-11-15 DIAGNOSIS — Z131 Encounter for screening for diabetes mellitus: Secondary | ICD-10-CM | POA: Diagnosis not present

## 2021-11-15 DIAGNOSIS — R0683 Snoring: Secondary | ICD-10-CM

## 2021-11-15 DIAGNOSIS — Z0000 Encounter for general adult medical examination without abnormal findings: Secondary | ICD-10-CM

## 2021-11-15 LAB — POCT URINALYSIS DIP (PROADVANTAGE DEVICE)
Bilirubin, UA: NEGATIVE
Blood, UA: NEGATIVE
Glucose, UA: NEGATIVE mg/dL
Ketones, POC UA: NEGATIVE mg/dL
Leukocytes, UA: NEGATIVE
Nitrite, UA: NEGATIVE
Protein Ur, POC: NEGATIVE mg/dL
Specific Gravity, Urine: 1.03
Urobilinogen, Ur: NEGATIVE
pH, UA: 6 (ref 5.0–8.0)

## 2021-11-15 NOTE — Patient Instructions (Addendum)
Your EKG heart marker was normal today  Consider eating plans such as Whole 30, Weight Watchers program, [place] Diet.  Work on getting 150+ minutes of exercise weekly  I will likely pursue a medication such as Wegovy to help lose weight along with referral to bariatric clinic for possible consideration of bariatric surgery  We may pursue CPAP to help with fatigue and mild sleep apnea.   continue your current medications    This visit was a preventative care visit, also known as wellness visit or routine physical.   Topics typically include healthy lifestyle, diet, exercise, preventative care, vaccinations, sick and well care, proper use of emergency dept and after hours care, as well as other concerns.     Recommendations: Continue to return yearly for your annual wellness and preventative care visits.  This gives [place] a chance to discuss healthy lifestyle, exercise, vaccinations, review your chart record, and perform screenings where appropriate.  I recommend you see your eye doctor yearly for routine vision care.  I recommend you see your dentist yearly for routine dental care including hygiene visits twice yearly.   Vaccination recommendations were reviewed Immunization History  Administered Date(s) Administered   [employer] Vaccination 07/26/2020   Moderna Sars-Covid-2 Vaccination 08/13/2019, 09/10/2019   Tdap 07/26/2020    I recommend a yearly flu shot in the fall   Screening for cancer: Colon cancer screening: Age 67  Testicular cancer screening You should do a monthly self testicular exam if you are between 105-30 years old  We discussed PSA, prostate exam, and prostate cancer screening risks/benefits.   Age 40  Skin cancer screening: Check your skin regularly for new changes, growing lesions, or other lesions of concern Come in for evaluation if you have skin lesions of concern.  Lung cancer screening: If you have a greater than 20 pack year  history of tobacco use, then you may qualify for lung cancer screening with a chest CT scan.   Please call your insurance company to inquire about coverage for this test.  We currently don't have screenings for other cancers besides breast, cervical, colon, and lung cancers.  If you have a strong family history of cancer or have other cancer screening concerns, please let me know.    Bone health: Get at least 150 minutes of aerobic exercise weekly Get weight bearing exercise at least once weekly Bone density test:  A bone density test is an imaging test that uses a type of X-ray to measure the amount of calcium and other minerals in your bones. The test may be used to diagnose or screen you for a condition that causes weak or thin bones (osteoporosis), predict your risk for a broken bone (fracture), or determine how well your osteoporosis treatment is working. The bone density test is recommended for females 65 and older, or females or males <65 if certain risk factors such as thyroid disease, long term use of steroids such as for asthma or rheumatological issues, vitamin D deficiency, estrogen deficiency, family history of osteoporosis, self or family history of fragility fracture in first degree relative.    Heart health: Get at least 150 minutes of aerobic exercise weekly Limit alcohol It is important to maintain a healthy blood pressure and healthy cholesterol numbers  Heart disease screening: Screening for heart disease includes screening for blood pressure, fasting lipids, glucose/diabetes screening, BMI height to weight ratio, reviewed of smoking status, physical activity, and diet.    Goals include blood pressure 120/80 or  less, maintaining a healthy lipid/cholesterol profile, preventing diabetes or keeping diabetes numbers under good control, not smoking or using tobacco products, exercising most days per week or at least 150 minutes per week of exercise, and eating healthy variety of  fruits and vegetables, healthy oils, and avoiding unhealthy food choices like fried food, fast food, high sugar and high cholesterol foods.    CT coronary test 05/25/21 normal   Medical care options: I recommend you continue to seek care here first for routine care.  We try really hard to have available appointments Monday through Friday daytime hours for sick visits, acute visits, and physicals.  Urgent care should be used for after hours and weekends for significant issues that cannot wait till the next day.  The emergency department should be used for significant potentially life-threatening emergencies.  The emergency department is expensive, can often have long wait times for less significant concerns, so try to utilize primary care, urgent care, or telemedicine when possible to avoid unnecessary trips to the emergency department.  Virtual visits and telemedicine have been introduced since the pandemic started in 2020, and can be convenient ways to receive medical care.  We offer virtual appointments as well to assist you in a variety of options to seek medical care.   Advanced Directives: I recommend you consider completing a Health Care Power of Attorney and Living Will.   These documents respect your wishes and help alleviate burdens on your loved ones if you were to become terminally ill or be in a position to need those documents enforced.    You can complete Advanced Directives yourself, have them notarized, then have copies made for our office, for you and for anybody you feel should have them in safe keeping.  Or, you can have an attorney prepare these documents.   If you haven't updated your Last Will and Testament in a while, it may be worthwhile having an attorney prepare these documents together and save on some costs.       Separate significant issues discussed: Hypertension-at goal, continue current medication  Hyperlipidemia-updated labs today, compliant with  medication  Shortness of breath-EKG and labs today.  Likely due to body habitus and deconditioning  Fatigue-we discussed many potential causes.  If testosterone and thyroid labs are okay we will consider trial of CPAP.  I reviewed his sleep study from last year that showed mild sleep apnea with some desaturations.  He might still benefit from the CPAP.  Obesity-we discussed the need to get some significant weight off.  We will likely pursue GLP-1 medication and referral to bariatric surgery.  We have discussed both of these today  Impaired glucose-updated labs today  GERD-avoid triggers, consider medication to help with symptoms,work on losing weight

## 2021-11-15 NOTE — Progress Notes (Signed)
Subjective:   HPI  Chris Carlson is a 36 y.o. male who presents for Chief Complaint  Patient presents with   fasting cpe    Fasting cpe, very drained all the time, no energy,fatigue    Patient Care Team: Avalynne Diver, Cleda Mccreedy as PCP - General (Family Medicine) Sees dentist Sees eye doctor Dr. Rollene Rotunda, cardiology   Concerns: Hypertension-compliant with medications  Hyperlipidemia- compliant with medications  Main concern today is feeling drained all the time of energy.  This has been going on for months.  Does get short of breath but no chest pain.  He also gets some acid reflux/froth in the mouth.  There has been concerns about him snoring.  He had a sleep study last year with CPAP as well available in his sleep study was on the mild side of sleep apnea  Moood -he feels like his mood is fine but his wife sometimes says he could be anxious or agitated.    Walking some for exercise   Reviewed their medical, surgical, family, social, medication, and allergy history and updated chart as appropriate.  Past Medical History:  Diagnosis Date   Chronic headache    since childhood, no prior neurology consult   Depression    Hyperlipidemia    Hypertension    Obesity     Past Surgical History:  Procedure Laterality Date   ANKLE SURGERY     right    Family History  Problem Relation Age of Onset   Anemia Mother    Hypertension Maternal Grandmother    Diabetes Maternal Grandmother    Cancer Maternal Grandmother        breast   Cancer Maternal Grandfather        prostate   Diabetes Paternal Grandmother    Hypertension Paternal Grandmother    Cancer Paternal Grandfather        prostate   Heart disease Paternal Grandfather        MI   Stroke Neg Hx      Current Outpatient Medications:    atenolol (TENORMIN) 50 MG tablet, Take 1 tablet (50 mg total) by mouth daily., Disp: 90 tablet, Rfl: 3   atorvastatin (LIPITOR) 40 MG tablet, Take 1 tablet (40 mg  total) by mouth daily., Disp: 90 tablet, Rfl: 3   valsartan-hydrochlorothiazide (DIOVAN-HCT) 80-12.5 MG tablet, TAKE 1 TABLET BY MOUTH EVERY DAY, Disp: 90 tablet, Rfl: 0  No Known Allergies   Review of Systems Constitutional: -fever, -chills, -sweats, -unexpected weight change, -decreased appetite, +fatigue Allergy: -sneezing, -itching, -congestion Dermatology: -changing moles, --rash, -lumps ENT: -runny nose, -ear pain, -sore throat, -hoarseness, -sinus pain, -teeth pain, - ringing in ears, -hearing loss, -nosebleeds Cardiology: -chest pain, -palpitations, -swelling, -difficulty breathing when lying flat, -waking up short of breath Respiratory: -cough, +shortness of breath, -difficulty breathing with exercise or exertion, -wheezing, -coughing up blood Gastroenterology: -abdominal pain, -nausea, -vomiting, -diarrhea, -constipation, -blood in stool, -changes in bowel movement, -difficulty swallowing or eating Hematology: -bleeding, -bruising  Musculoskeletal: -joint aches, -muscle aches, -joint swelling, -back pain, -neck pain, -cramping, -changes in gait Ophthalmology: denies vision changes, eye redness, itching, discharge Urology: -burning with urination, -difficulty urinating, -blood in urine, -urinary frequency, -urgency, -incontinence Neurology: -headache, -weakness, -tingling, -numbness, -memory loss, -falls, -dizziness Psychology: -depressed mood, -agitation, -sleep problems Male GU: no testicular mass, pain, no lymph nodes swollen, no swelling, no rash.     11/15/2021    8:31 AM 04/16/2021   11:11 AM 07/26/2020    1:30 PM 05/31/2020  2:40 PM  Depression screen PHQ 2/9  Decreased Interest 0 1 0 0  Down, Depressed, Hopeless 0 1 0 0  PHQ - 2 Score 0 2 0 0  Altered sleeping  3    Tired, decreased energy  3    Change in appetite  1    Feeling bad or failure about yourself   1    Trouble concentrating  1    Moving slowly or fidgety/restless  0    Suicidal thoughts  0    PHQ-9  Score  11    Difficult doing work/chores  Not difficult at all          Objective:  BP 122/80   Pulse 92   Ht 5\' 9"  (1.753 m)   Wt (!) 397 lb 8 oz (180.3 kg)   BMI 58.70 kg/m   General appearance: alert, no distress, WD/WN, African American male, morbidly obese Skin: tattoo left medial forearm, skin tags right lateral neck, otherwise unremarkable HEENT: normocephalic, conjunctiva/corneas normal, sclerae anicteric, PERRLA, EOMi, nares patent, no discharge or erythema, pharynx normal Oral cavity: MMM, tongue normal, teeth in good repair Neck: supple, no lymphadenopathy, no thyromegaly, no masses, normal ROM, no bruits Chest: non tender, normal shape and expansion Heart: RRR, normal S1, S2, no murmurs Lungs: CTA bilaterally, no wheezes, rhonchi, or rales Abdomen: +bs, soft, non tender, non distended, no masses, no hepatomegaly, no splenomegaly, no bruits Back: non tender, normal ROM, no scoliosis Musculoskeletal: upper extremities non tender, no obvious deformity, normal ROM throughout, lower extremities non tender, no obvious deformity, normal ROM throughout Extremities: no edema, no cyanosis, no clubbing Pulses: 2+ symmetric, upper and lower extremities, normal cap refill Neurological: alert, oriented x 3, CN2-12 intact, strength normal upper extremities and lower extremities, sensation normal throughout, DTRs 2+ throughout, no cerebellar signs, gait normal Psychiatric: normal affect, behavior normal, pleasant  GU: normal male external genitalia, nontender, no masses, no hernia, no lymphadenopathy Rectal: deferred  EKG reviewed    Assessment and Plan :   Encounter Diagnoses  Name Primary?   Encounter for health maintenance examination in adult Yes   SOB (shortness of breath)    Screening for lipid disorders    Screening for diabetes mellitus    OSA (obstructive sleep apnea)    Essential hypertension, benign    Dyslipidemia    Snoring    Other fatigue    Impaired  fasting blood sugar     This visit was a preventative care visit, also known as wellness visit or routine physical.   Topics typically include healthy lifestyle, diet, exercise, preventative care, vaccinations, sick and well care, proper use of emergency dept and after hours care, as well as other concerns.     Recommendations: Continue to return yearly for your annual wellness and preventative care visits.  This gives [place] a chance to discuss healthy lifestyle, exercise, vaccinations, review your chart record, and perform screenings where appropriate.  I recommend you see your eye doctor yearly for routine vision care.  I recommend you see your dentist yearly for routine dental care including hygiene visits twice yearly.   Vaccination recommendations were reviewed Immunization History  Administered Date(s) Administered   [employer] Vaccination 07/26/2020   Moderna Sars-Covid-2 Vaccination 08/13/2019, 09/10/2019   Tdap 07/26/2020    I recommend a yearly flu shot in the fall   Screening for cancer: Colon cancer screening: Age 86  Testicular cancer screening You should do a monthly self testicular exam if you are  between 52-14 years old  We discussed PSA, prostate exam, and prostate cancer screening risks/benefits.   Age 32  Skin cancer screening: Check your skin regularly for new changes, growing lesions, or other lesions of concern Come in for evaluation if you have skin lesions of concern.  Lung cancer screening: If you have a greater than 20 pack year history of tobacco use, then you may qualify for lung cancer screening with a chest CT scan.   Please call your insurance company to inquire about coverage for this test.  We currently don't have screenings for other cancers besides breast, cervical, colon, and lung cancers.  If you have a strong family history of cancer or have other cancer screening concerns, please let me know.    Bone health: Get at least  150 minutes of aerobic exercise weekly Get weight bearing exercise at least once weekly Bone density test:  A bone density test is an imaging test that uses a type of X-ray to measure the amount of calcium and other minerals in your bones. The test may be used to diagnose or screen you for a condition that causes weak or thin bones (osteoporosis), predict your risk for a broken bone (fracture), or determine how well your osteoporosis treatment is working. The bone density test is recommended for females 65 and older, or females or males <65 if certain risk factors such as thyroid disease, long term use of steroids such as for asthma or rheumatological issues, vitamin D deficiency, estrogen deficiency, family history of osteoporosis, self or family history of fragility fracture in first degree relative.    Heart health: Get at least 150 minutes of aerobic exercise weekly Limit alcohol It is important to maintain a healthy blood pressure and healthy cholesterol numbers  Heart disease screening: Screening for heart disease includes screening for blood pressure, fasting lipids, glucose/diabetes screening, BMI height to weight ratio, reviewed of smoking status, physical activity, and diet.    Goals include blood pressure 120/80 or less, maintaining a healthy lipid/cholesterol profile, preventing diabetes or keeping diabetes numbers under good control, not smoking or using tobacco products, exercising most days per week or at least 150 minutes per week of exercise, and eating healthy variety of fruits and vegetables, healthy oils, and avoiding unhealthy food choices like fried food, fast food, high sugar and high cholesterol foods.    CT coronary test 05/25/21 normal   Medical care options: I recommend you continue to seek care here first for routine care.  We try really hard to have available appointments Monday through Friday daytime hours for sick visits, acute visits, and physicals.  Urgent care  should be used for after hours and weekends for significant issues that cannot wait till the next day.  The emergency department should be used for significant potentially life-threatening emergencies.  The emergency department is expensive, can often have long wait times for less significant concerns, so try to utilize primary care, urgent care, or telemedicine when possible to avoid unnecessary trips to the emergency department.  Virtual visits and telemedicine have been introduced since the pandemic started in 2020, and can be convenient ways to receive medical care.  We offer virtual appointments as well to assist you in a variety of options to seek medical care.   Advanced Directives: I recommend you consider completing a Health Care Power of Attorney and Living Will.   These documents respect your wishes and help alleviate burdens on your loved ones if you were to become terminally  ill or be in a position to need those documents enforced.    You can complete Advanced Directives yourself, have them notarized, then have copies made for our office, for you and for anybody you feel should have them in safe keeping.  Or, you can have an attorney prepare these documents.   If you haven't updated your Last Will and Testament in a while, it may be worthwhile having an attorney prepare these documents together and save on some costs.       Separate significant issues discussed: Hypertension-at goal, continue current medication  Hyperlipidemia-updated labs today, compliant with medication  Shortness of breath-EKG and labs today.  Likely due to body habitus and deconditioning  Fatigue-we discussed many potential causes.  If testosterone and thyroid labs are okay we will consider trial of CPAP.  I reviewed his sleep study from last year that showed mild sleep apnea with some desaturations.  He might still benefit from the CPAP.  Obesity-we discussed the need to get some significant weight off.  We will  likely pursue GLP-1 medication and referral to bariatric surgery.  We have discussed both of these today  Impaired glucose-updated labs today  GERD-avoid triggers, consider medication to help with symptoms,work on losing weight   Antwoine was seen today for fasting cpe.  Diagnoses and all orders for this visit:  Encounter for health maintenance examination in adult -     Comprehensive metabolic panel -     CBC with Differential/Platelet -     Lipid panel -     Hemoglobin A1c -     POCT Urinalysis DIP (Proadvantage Device) -     Testosterone -     TSH -     EKG 12-Lead  SOB (shortness of breath) -     EKG 12-Lead  Screening for lipid disorders  Screening for diabetes mellitus -     Hemoglobin A1c  OSA (obstructive sleep apnea)  Essential hypertension, benign  Dyslipidemia -     Lipid panel  Snoring  Other fatigue -     Testosterone -     TSH -     EKG 12-Lead  Impaired fasting blood sugar -     Hemoglobin A1c   Follow-up pending labs, yearly for physical

## 2021-11-16 ENCOUNTER — Other Ambulatory Visit: Payer: Self-pay | Admitting: Internal Medicine

## 2021-11-16 ENCOUNTER — Telehealth: Payer: Self-pay | Admitting: Medical

## 2021-11-16 ENCOUNTER — Other Ambulatory Visit: Payer: Self-pay | Admitting: Medical

## 2021-11-16 DIAGNOSIS — Z6841 Body Mass Index (BMI) 40.0 and over, adult: Secondary | ICD-10-CM

## 2021-11-16 LAB — COMPREHENSIVE METABOLIC PANEL
ALT: 19 IU/L (ref 0–44)
AST: 14 IU/L (ref 0–40)
Albumin/Globulin Ratio: 1.3 (ref 1.2–2.2)
Albumin: 4.2 g/dL (ref 4.1–5.1)
Alkaline Phosphatase: 48 IU/L (ref 44–121)
BUN/Creatinine Ratio: 8 — ABNORMAL LOW (ref 9–20)
BUN: 9 mg/dL (ref 6–20)
Bilirubin Total: 0.3 mg/dL (ref 0.0–1.2)
CO2: 22 mmol/L (ref 20–29)
Calcium: 9.3 mg/dL (ref 8.7–10.2)
Chloride: 100 mmol/L (ref 96–106)
Creatinine, Ser: 1.09 mg/dL (ref 0.76–1.27)
Globulin, Total: 3.3 g/dL (ref 1.5–4.5)
Glucose: 100 mg/dL — ABNORMAL HIGH (ref 70–99)
Potassium: 4.2 mmol/L (ref 3.5–5.2)
Sodium: 139 mmol/L (ref 134–144)
Total Protein: 7.5 g/dL (ref 6.0–8.5)
eGFR: 90 mL/min/{1.73_m2} (ref >59)

## 2021-11-16 LAB — CBC WITH DIFFERENTIAL/PLATELET
Basophils Absolute: 0 10*3/uL (ref 0.0–0.2)
Basos: 1 %
EOS (ABSOLUTE): 0 10*3/uL (ref 0.0–0.4)
Eos: 1 %
Hematocrit: 40.7 % (ref 37.5–51.0)
Hemoglobin: 12.9 g/dL — ABNORMAL LOW (ref 13.0–17.7)
Immature Grans (Abs): 0 10*3/uL (ref 0.0–0.1)
Immature Granulocytes: 0 %
Lymphocytes Absolute: 2.8 10*3/uL (ref 0.7–3.1)
Lymphs: 44 %
MCH: 23.4 pg — ABNORMAL LOW (ref 26.6–33.0)
MCHC: 31.7 g/dL (ref 31.5–35.7)
MCV: 74 fL — ABNORMAL LOW (ref 79–97)
Monocytes Absolute: 0.4 10*3/uL (ref 0.1–0.9)
Monocytes: 7 %
Neutrophils Absolute: 3.1 10*3/uL (ref 1.4–7.0)
Neutrophils: 47 %
Platelets: 267 10*3/uL (ref 150–450)
RBC: 5.51 x10E6/uL (ref 4.14–5.80)
RDW: 15.1 % (ref 11.6–15.4)
WBC: 6.4 10*3/uL (ref 3.4–10.8)

## 2021-11-16 LAB — LIPID PANEL
Chol/HDL Ratio: 4.4 ratio (ref 0.0–5.0)
Cholesterol, Total: 176 mg/dL (ref 100–199)
HDL: 40 mg/dL (ref >39)
LDL Chol Calc (NIH): 117 mg/dL — ABNORMAL HIGH (ref 0–99)
Triglycerides: 101 mg/dL (ref 0–149)
VLDL Cholesterol Cal: 19 mg/dL (ref 5–40)

## 2021-11-16 LAB — HEMOGLOBIN A1C
Est. average glucose Bld gHb Est-mCnc: 123 mg/dL
Hgb A1c MFr Bld: 5.9 % — ABNORMAL HIGH (ref 4.8–5.6)

## 2021-11-16 LAB — TESTOSTERONE: Testosterone: 444 ng/dL (ref 264–916)

## 2021-11-16 LAB — TSH: TSH: 2.98 u[IU]/mL (ref 0.450–4.500)

## 2021-11-16 MED ORDER — WEGOVY 0.25 MG/0.5ML ~~LOC~~ SOAJ: 0.2500 mg | SUBCUTANEOUS | 0 refills | Status: DC

## 2021-11-16 MED ORDER — ATENOLOL 50 MG PO TABS: 50.0000 mg | ORAL_TABLET | Freq: Every day | ORAL | 3 refills | Status: DC

## 2021-11-16 MED ORDER — VALSARTAN-HYDROCHLOROTHIAZIDE 80-12.5 MG PO TABS: 1.0000 | ORAL_TABLET | Freq: Every day | ORAL | 3 refills | Status: DC

## 2021-11-16 MED ORDER — OMEPRAZOLE 40 MG PO CPDR: 40.0000 mg | DELAYED_RELEASE_CAPSULE | Freq: Every day | ORAL | 1 refills | Status: DC

## 2021-11-16 MED ORDER — ATORVASTATIN CALCIUM 40 MG PO TABS: 40.0000 mg | ORAL_TABLET | Freq: Every day | ORAL | 3 refills | Status: DC

## 2021-11-16 NOTE — Telephone Encounter (Signed)
P.A. WEGOVY 

## 2021-12-05 NOTE — Telephone Encounter (Signed)
P.A. approved til 03/21/22, sent mychart message

## 2021-12-16 ENCOUNTER — Other Ambulatory Visit: Payer: Self-pay | Admitting: Medical

## 2021-12-18 ENCOUNTER — Ambulatory Visit (INDEPENDENT_AMBULATORY_CARE_PROVIDER_SITE_OTHER): Payer: BC Managed Care – PPO | Admitting: Medical

## 2021-12-18 ENCOUNTER — Telehealth: Payer: Self-pay | Admitting: Medical

## 2021-12-18 VITALS — BP 122/84 | HR 82 | Wt >= 6400 oz

## 2021-12-18 DIAGNOSIS — R7301 Impaired fasting glucose: Secondary | ICD-10-CM | POA: Diagnosis not present

## 2021-12-18 DIAGNOSIS — G4733 Obstructive sleep apnea (adult) (pediatric): Secondary | ICD-10-CM | POA: Diagnosis not present

## 2021-12-18 DIAGNOSIS — I10 Essential (primary) hypertension: Secondary | ICD-10-CM | POA: Diagnosis not present

## 2021-12-18 DIAGNOSIS — R0683 Snoring: Secondary | ICD-10-CM | POA: Diagnosis not present

## 2021-12-18 DIAGNOSIS — E785 Hyperlipidemia, unspecified: Secondary | ICD-10-CM

## 2021-12-18 DIAGNOSIS — Z6841 Body Mass Index (BMI) 40.0 and over, adult: Secondary | ICD-10-CM

## 2021-12-18 NOTE — Telephone Encounter (Signed)
Contact [facility] and was told to log in to portal and rewatch seminar or contact melissa portal account person at [facility] [phone].   Pt was notified of this and will call

## 2021-12-18 NOTE — Patient Instructions (Signed)
Call your insurer about the following:  Regarding CPAP Ask if your insurance covers CPAP If they do, do you have to meeting deductible or a % copay Find out so you know what you are dealing with Keep in mind, some people purchase CPAP outright without rental program.    Regarding weight loss medicaiton Call insurance to see what the preferred medicaiton is on their formulary, what your copay would be Ask if there is a medication deductible  Options for weight loss medicaiton include: Wegovy weekly injection Saxenda daily injection Qsymia capsule daily Contrave 2 tablets twice daily  Off label use of Ozempic or Rybelsus or Truility which are diabetic medicaiton

## 2021-12-18 NOTE — Progress Notes (Signed)
Subjective:  Chris Carlson is a 36 y.o. male who presents for Chief Complaint  Patient presents with   1 month follow-up    1 month follow-up on weight loss. Not doing wegovy due to cost and not available. Has to watch video for bariatric clinic but hasn't done it yet     Here for follow-up from his physical visit in July.  At that time we made several recommendations including starting [facility] weight loss medicine, referral to bariatric clinic, referred for CPAP for sleep apnea.  Unfortunately Reginal Lutes was too expensive or possibly not covered by insurance so he has not started this.  He has been in touch with the CPAP supplier but he has the, over $400 initially and has not done that yet.  He did get a call from bariatric clinic.  They informed him that he had to watch a video first.  He tried to upload the link and is not working.  He also went on a cruise for vacation recently so that did not help his efforts to lose weight  No other new issues.  No other aggravating or relieving factors.    No other c/o.  Past Medical History:  Diagnosis Date   Chronic headache    since childhood, no prior neurology consult   Depression    Hyperlipidemia    Hypertension    Obesity    Current Outpatient Medications on File Prior to Visit  Medication Sig Dispense Refill   atenolol (TENORMIN) 50 MG tablet Take 1 tablet (50 mg total) by mouth daily. 90 tablet 3   atorvastatin (LIPITOR) 40 MG tablet Take 1 tablet (40 mg total) by mouth daily. 90 tablet 3   omeprazole (PRILOSEC) 40 MG capsule TAKE 1 CAPSULE (40 MG TOTAL) BY MOUTH DAILY. 30 capsule 5   valsartan-hydrochlorothiazide (DIOVAN-HCT) 80-12.5 MG tablet Take 1 tablet by mouth daily. 90 tablet 3   No current facility-administered medications on file prior to visit.     The following portions of the patient's history were reviewed and updated as appropriate: allergies, current medications, past family history, past medical history, past  social history, past surgical history and problem list.  ROS Otherwise as in subjective above  Objective: BP 122/84   Pulse 82   Wt (!) 402 lb 9.6 oz (182.6 kg)   BMI 59.45 kg/m   General appearance: alert, no distress, well developed, well nourished   Assessment: Encounter Diagnoses  Name Primary?   Snoring Yes   OSA (obstructive sleep apnea)    Impaired fasting blood sugar    Essential hypertension, benign    Dyslipidemia    BMI 50.0-59.9, adult (HCC)      Plan: We discussed the medication recommendations, we discussed his delays in starting therapy given financial difficulties, co-pays and difficulty with getting in touch with the bariatric clinic.  We will call and try to work on the communication between him and bariatric clinic to get him in for an appointment.  I gave him a list of things to call the insurance to ask about so we can look into a weight loss medicine and to get his CPAP therapy going.  Continue efforts to lose weight through healthy diet and exercise.   Patient Instructions  Call your insurer about the following:  Regarding CPAP Ask if your insurance covers CPAP If they do, do you have to meeting deductible or a % copay Find out so you know what you are dealing with Keep in mind, some people  purchase CPAP outright without rental program.    Regarding weight loss [employer] to see what the preferred medicaiton is on their formulary, what your copay would be Ask if there is a medication deductible  Options for weight loss medicaiton include: Wegovy weekly injection Saxenda daily injection Qsymia capsule daily Contrave 2 tablets twice daily  Off label use of Ozempic or Rybelsus or Truility which are diabetic medicaiton       Follow up: pending call back

## 2021-12-18 NOTE — Telephone Encounter (Signed)
Please call [facility] bariatric clinic.  See if we need to do anything to get this patient in the door. He had initial communication with them, but they required him watch a video first.  He tried but the video link wasn't working and he couldn't get any body there to answer the phone

## 2022-01-02 ENCOUNTER — Encounter: Payer: Self-pay | Admitting: Internal Medicine

## 2022-02-05 ENCOUNTER — Encounter: Payer: Self-pay | Admitting: Internal Medicine

## 2022-02-14 DIAGNOSIS — Z23 Encounter for immunization: Secondary | ICD-10-CM | POA: Diagnosis not present

## 2022-11-20 ENCOUNTER — Encounter: Payer: Self-pay | Admitting: Medical

## 2022-11-20 ENCOUNTER — Ambulatory Visit: Payer: BC Managed Care – PPO | Admitting: Medical

## 2022-11-20 VITALS — BP 122/84 | HR 86 | Ht 70.0 in | Wt >= 6400 oz

## 2022-11-20 DIAGNOSIS — Z131 Encounter for screening for diabetes mellitus: Secondary | ICD-10-CM | POA: Diagnosis not present

## 2022-11-20 DIAGNOSIS — Z8042 Family history of malignant neoplasm of prostate: Secondary | ICD-10-CM | POA: Diagnosis not present

## 2022-11-20 DIAGNOSIS — R7301 Impaired fasting glucose: Secondary | ICD-10-CM | POA: Diagnosis not present

## 2022-11-20 DIAGNOSIS — R5383 Other fatigue: Secondary | ICD-10-CM | POA: Diagnosis not present

## 2022-11-20 DIAGNOSIS — E785 Hyperlipidemia, unspecified: Secondary | ICD-10-CM | POA: Diagnosis not present

## 2022-11-20 DIAGNOSIS — I10 Essential (primary) hypertension: Secondary | ICD-10-CM

## 2022-11-20 DIAGNOSIS — Z0000 Encounter for general adult medical examination without abnormal findings: Secondary | ICD-10-CM | POA: Diagnosis not present

## 2022-11-20 DIAGNOSIS — G4733 Obstructive sleep apnea (adult) (pediatric): Secondary | ICD-10-CM

## 2022-11-20 LAB — POCT URINALYSIS DIP (PROADVANTAGE DEVICE)
Blood, UA: NEGATIVE
Glucose, UA: NEGATIVE mg/dL
Ketones, POC UA: NEGATIVE mg/dL
Nitrite, UA: NEGATIVE
Protein Ur, POC: NEGATIVE mg/dL
Specific Gravity, Urine: 1.025
Urobilinogen, Ur: NEGATIVE
pH, UA: 6 (ref 5.0–8.0)

## 2022-11-20 LAB — COMPREHENSIVE METABOLIC PANEL

## 2022-11-20 LAB — CBC WITH DIFFERENTIAL/PLATELET
Basophils Absolute: 0 10*3/uL (ref 0.0–0.2)
EOS (ABSOLUTE): 0.1 10*3/uL (ref 0.0–0.4)
Eos: 1 %
Hematocrit: 40.4 % (ref 37.5–51.0)
Immature Grans (Abs): 0 10*3/uL (ref 0.0–0.1)
Immature Granulocytes: 0 %
Lymphocytes Absolute: 2.5 10*3/uL (ref 0.7–3.1)
MCH: 23 pg — ABNORMAL LOW (ref 26.6–33.0)
MCV: 74 fL — ABNORMAL LOW (ref 79–97)
Neutrophils Absolute: 2.7 10*3/uL (ref 1.4–7.0)
WBC: 5.7 10*3/uL (ref 3.4–10.8)

## 2022-11-20 LAB — LIPID PANEL

## 2022-11-20 MED ORDER — OMEPRAZOLE 40 MG PO CPDR: 40.0000 mg | DELAYED_RELEASE_CAPSULE | Freq: Every day | ORAL | 3 refills | Status: DC

## 2022-11-20 MED ORDER — VALSARTAN-HYDROCHLOROTHIAZIDE 80-12.5 MG PO TABS: 1.0000 | ORAL_TABLET | Freq: Every day | ORAL | 3 refills | Status: DC

## 2022-11-20 MED ORDER — ATORVASTATIN CALCIUM 40 MG PO TABS: 40.0000 mg | ORAL_TABLET | Freq: Every day | ORAL | 3 refills | Status: DC

## 2022-11-20 NOTE — Patient Instructions (Signed)
Please contact [employer] for your CPAP machine and supplies. [phone]

## 2022-11-20 NOTE — Progress Notes (Signed)
Pt needs to contact [employer], I have put this in his aftervisit summary [phone]

## 2022-11-20 NOTE — Progress Notes (Signed)
Subjective:   HPI  Chris Carlson is a 37 y.o. male who presents for Chief Complaint  Patient presents with   Annual Exam    Fasting cpe, Been out of medication x 2 months, feels drained all the time. Discuss weight- wegovy was too expensive    Patient Care Team: Lashanda Storlie, Kermit Balo, PA-C as PCP - General (Family Medicine) Sees dentist Sees eye doctor Dr. Rollene Rotunda, cardiology   Concerns: He reports he has been out of his medication for 2 months.  Feels fatigued.  Last year we discussed weight loss medicine but apparently Reginal Lutes was too expensive.  OSA diagnosed 2022.  Despite him and [place] corresponding with supplier, he was never able to get CPAP supplies.  Never started CPAP.  Exercise - walking some.   Reviewed their medical, surgical, family, social, medication, and allergy history and updated chart as appropriate.  Past Medical History:  Diagnosis Date   Chronic headache    since childhood, no prior neurology consult   Depression    Hyperlipidemia    Hypertension    Obesity     Past Surgical History:  Procedure Laterality Date   ANKLE SURGERY     right    Family History  Problem Relation Age of Onset   Anemia Mother    Hypertension Maternal Grandmother    Diabetes Maternal Grandmother    Cancer Maternal Grandmother        breast   Cancer Maternal Grandfather        prostate   Diabetes Paternal Grandmother    Hypertension Paternal Grandmother    Cancer Paternal Grandfather        prostate   Heart disease Paternal Grandfather        MI   Stroke Neg Hx      Current Outpatient Medications:    atorvastatin (LIPITOR) 40 MG tablet, Take 1 tablet (40 mg total) by mouth daily., Disp: 90 tablet, Rfl: 3   omeprazole (PRILOSEC) 40 MG capsule, Take 1 capsule (40 mg total) by mouth daily., Disp: 90 capsule, Rfl: 3   valsartan-hydrochlorothiazide (DIOVAN-HCT) 80-12.5 MG tablet, Take 1 tablet by mouth daily., Disp: 90 tablet, Rfl: 3  No Known  Allergies   Review of Systems  Constitutional:  Negative for chills, fever, malaise/fatigue and weight loss.  HENT:  Negative for congestion, ear pain, hearing loss, sore throat and tinnitus.   Eyes:  Negative for blurred vision, pain and redness.  Respiratory:  Negative for cough, hemoptysis and shortness of breath.   Cardiovascular:  Negative for chest pain, palpitations, orthopnea, claudication and leg swelling.  Gastrointestinal:  Negative for abdominal pain, blood in stool, constipation, diarrhea, nausea and vomiting.  Genitourinary:  Negative for dysuria, flank pain, frequency, hematuria and urgency.  Musculoskeletal:  Negative for falls, joint pain and myalgias.  Skin:  Negative for itching and rash.  Neurological:  Negative for dizziness, tingling, speech change, weakness and headaches.  Endo/Heme/Allergies:  Negative for polydipsia. Does not bruise/bleed easily.  Psychiatric/Behavioral:  Negative for depression and memory loss. The patient is not nervous/anxious and does not have insomnia.         11/20/2022    8:37 AM 11/15/2021    8:31 AM 04/16/2021   11:11 AM 07/26/2020    1:30 PM 05/31/2020    2:40 PM  Depression screen PHQ 2/9  Decreased Interest 0 0 1 0 0  Down, Depressed, Hopeless 0 0 1 0 0  PHQ - 2 Score 0 0 2 0 0  Altered sleeping   3    Tired, decreased energy   3    Change in appetite   1    Feeling bad or failure about yourself    1    Trouble concentrating   1    Moving slowly or fidgety/restless   0    Suicidal thoughts   0    PHQ-9 Score   11    Difficult doing work/chores   Not difficult at all          Objective:  BP 122/84   Pulse 86   Ht 5\' 10"  (1.778 m)   Wt (!) 409 lb (185.5 kg)   BMI 58.69 kg/m   Wt Readings from Last 3 Encounters:  11/20/22 (!) 409 lb (185.5 kg)  12/18/21 (!) 402 lb 9.6 oz (182.6 kg)  11/15/21 (!) 397 lb 8 oz (180.3 kg)   BP Readings from Last 3 Encounters:  11/20/22 122/84  12/18/21 122/84  11/15/21 122/80     General appearance: alert, no distress, WD/WN, African American male, morbidly obese Skin: tattoo left medial forearm, skin tags right lateral neck, otherwise unremarkable HEENT: normocephalic, conjunctiva/corneas normal, sclerae anicteric, PERRLA, EOMi, nares patent, no discharge or erythema, pharynx normal Oral cavity: MMM, tongue normal, teeth in good repair Neck: supple, no lymphadenopathy, no thyromegaly, no masses, normal ROM, no bruits Chest: non tender, normal shape and expansion Heart: RRR, normal S1, S2, no murmurs Lungs: CTA bilaterally, no wheezes, rhonchi, or rales Abdomen: +bs, soft, non tender, non distended, no masses, no hepatomegaly, no splenomegaly, no bruits Back: non tender, normal ROM, no scoliosis Musculoskeletal: upper extremities non tender, no obvious deformity, normal ROM throughout, lower extremities non tender, no obvious deformity, normal ROM throughout Extremities: no edema, no cyanosis, no clubbing Pulses: 2+ symmetric, upper and lower extremities, normal cap refill Neurological: alert, oriented x 3, CN2-12 intact, strength normal upper extremities and lower extremities, sensation normal throughout, DTRs 2+ throughout, no cerebellar signs, gait normal Psychiatric: normal affect, behavior normal, pleasant  GU: deferred Rectal: deferred   Assessment and Plan :   Encounter Diagnoses  Name Primary?   Encounter for health maintenance examination in adult Yes   Dyslipidemia    Essential hypertension, benign    Family history of prostate cancer    Impaired fasting blood sugar    OSA (obstructive sleep apnea)    Screening for diabetes mellitus    Fatigue, unspecified type     This visit was a preventative care visit, also known as wellness visit or routine physical.   Topics typically include healthy lifestyle, diet, exercise, preventative care, vaccinations, sick and well care, proper use of emergency dept and after hours care, as well as other  concerns.     Recommendations: Continue to return yearly for your annual wellness and preventative care visits.  This gives [place] a chance to discuss healthy lifestyle, exercise, vaccinations, review your chart record, and perform screenings where appropriate.  I recommend you see your eye doctor yearly for routine vision care.  I recommend you see your dentist yearly for routine dental care including hygiene visits twice yearly.   Vaccination recommendations were reviewed Immunization History  Administered Date(s) Administered   [employer] Vaccination 07/26/2020   Moderna Sars-Covid-2 Vaccination 08/13/2019, 09/10/2019   Tdap 07/26/2020    I recommend a yearly flu shot in the fall   Screening for cancer: Colon cancer screening: Age 56  Testicular cancer screening You should do a monthly self testicular exam  if you are between 7-45 years old  We discussed PSA, prostate exam, and prostate cancer screening risks/benefits.   Age 60  Skin cancer screening: Check your skin regularly for new changes, growing lesions, or other lesions of concern Come in for evaluation if you have skin lesions of concern.  Lung cancer screening: If you have a greater than 20 pack year history of tobacco use, then you may qualify for lung cancer screening with a chest CT scan.   Please call your insurance company to inquire about coverage for this test.  We currently don't have screenings for other cancers besides breast, cervical, colon, and lung cancers.  If you have a strong family history of cancer or have other cancer screening concerns, please let me know.    Bone health: Get at least 150 minutes of aerobic exercise weekly Get weight bearing exercise at least once weekly Bone density test:  A bone density test is an imaging test that uses a type of X-ray to measure the amount of calcium and other minerals in your bones. The test may be used to diagnose or screen you for a  condition that causes weak or thin bones (osteoporosis), predict your risk for a broken bone (fracture), or determine how well your osteoporosis treatment is working. The bone density test is recommended for females 65 and older, or females or males <65 if certain risk factors such as thyroid disease, long term use of steroids such as for asthma or rheumatological issues, vitamin D deficiency, estrogen deficiency, family history of osteoporosis, self or family history of fragility fracture in first degree relative.    Heart health: Get at least 150 minutes of aerobic exercise weekly Limit alcohol It is important to maintain a healthy blood pressure and healthy cholesterol numbers  Heart disease screening: Screening for heart disease includes screening for blood pressure, fasting lipids, glucose/diabetes screening, BMI height to weight ratio, reviewed of smoking status, physical activity, and diet.    Goals include blood pressure 120/80 or less, maintaining a healthy lipid/cholesterol profile, preventing diabetes or keeping diabetes numbers under good control, not smoking or using tobacco products, exercising most days per week or at least 150 minutes per week of exercise, and eating healthy variety of fruits and vegetables, healthy oils, and avoiding unhealthy food choices like fried food, fast food, high sugar and high cholesterol foods.    CT coronary test 05/25/21 normal   Medical care options: I recommend you continue to seek care here first for routine care.  We try really hard to have available appointments Monday through Friday daytime hours for sick visits, acute visits, and physicals.  Urgent care should be used for after hours and weekends for significant issues that cannot wait till the next day.  The emergency department should be used for significant potentially life-threatening emergencies.  The emergency department is expensive, can often have long wait times for less significant  concerns, so try to utilize primary care, urgent care, or telemedicine when possible to avoid unnecessary trips to the emergency department.  Virtual visits and telemedicine have been introduced since the pandemic started in 2020, and can be convenient ways to receive medical care.  We offer virtual appointments as well to assist you in a variety of options to seek medical care.   Advanced Directives: I recommend you consider completing a Health Care Power of Attorney and Living Will.   These documents respect your wishes and help alleviate burdens on your loved ones if you were  to become terminally ill or be in a position to need those documents enforced.    You can complete Advanced Directives yourself, have them notarized, then have copies made for our office, for you and for anybody you feel should have them in safe keeping.  Or, you can have an attorney prepare these documents.   If you haven't updated your Last Will and Testament in a while, it may be worthwhile having an attorney prepare these documents together and save on some costs.       Separate significant issues discussed: Advised he talk to pharmacy as he shouldn't have run out of medication  Restart medication for BP, lipids  OSA - never started CPAP.  We will coordinate him starting CPAP  Obesity-counseled on diet, exercise  Impaired glucose-updated labs today  GERD-avoid triggers, consider medication to help with symptoms,work on losing weight   Schon was seen today for annual exam.  Diagnoses and all orders for this visit:  Encounter for health maintenance examination in adult -     Comprehensive metabolic panel -     CBC with Differential/Platelet -     Lipid panel -     Hemoglobin A1c -     POCT Urinalysis DIP (Proadvantage Device) -     Testosterone  Dyslipidemia -     Lipid panel  Essential hypertension, benign  Family history of prostate cancer  Impaired fasting blood sugar -     Hemoglobin  A1c  OSA (obstructive sleep apnea)  Screening for diabetes mellitus -     Hemoglobin A1c  Fatigue, unspecified type -     CBC with Differential/Platelet -     Testosterone  Other orders -     valsartan-hydrochlorothiazide (DIOVAN-HCT) 80-12.5 MG tablet; Take 1 tablet by mouth daily. -     atorvastatin (LIPITOR) 40 MG tablet; Take 1 tablet (40 mg total) by mouth daily. -     omeprazole (PRILOSEC) 40 MG capsule; Take 1 capsule (40 mg total) by mouth daily.   Follow-up pending labs, yearly for physical

## 2022-11-21 LAB — CBC WITH DIFFERENTIAL/PLATELET
Basos: 1 %
Hemoglobin: 12.5 g/dL — ABNORMAL LOW (ref 13.0–17.7)
Lymphs: 43 %
MCHC: 30.9 g/dL — ABNORMAL LOW (ref 31.5–35.7)
Monocytes Absolute: 0.5 10*3/uL (ref 0.1–0.9)
Monocytes: 8 %
Neutrophils: 47 %
Platelets: 302 10*3/uL (ref 150–450)
RBC: 5.44 x10E6/uL (ref 4.14–5.80)
RDW: 15.8 % — ABNORMAL HIGH (ref 11.6–15.4)

## 2022-11-21 LAB — TESTOSTERONE: Testosterone: 459 ng/dL (ref 264–916)

## 2022-11-21 LAB — LIPID PANEL
Cholesterol, Total: 246 mg/dL — ABNORMAL HIGH (ref 100–199)
HDL: 45 mg/dL (ref >39)

## 2022-11-21 LAB — COMPREHENSIVE METABOLIC PANEL
ALT: 22 IU/L (ref 0–44)
Albumin: 4.2 g/dL (ref 4.1–5.1)
Alkaline Phosphatase: 45 IU/L (ref 44–121)
BUN/Creatinine Ratio: 10 (ref 9–20)
CO2: 19 mmol/L — ABNORMAL LOW (ref 20–29)
Creatinine, Ser: 1.06 mg/dL (ref 0.76–1.27)
Glucose: 101 mg/dL — ABNORMAL HIGH (ref 70–99)
Sodium: 138 mmol/L (ref 134–144)

## 2022-11-21 LAB — HEMOGLOBIN A1C
Est. average glucose Bld gHb Est-mCnc: 128 mg/dL
Hgb A1c MFr Bld: 6.1 % — ABNORMAL HIGH (ref 4.8–5.6)

## 2022-11-22 ENCOUNTER — Emergency Department (HOSPITAL_BASED_OUTPATIENT_CLINIC_OR_DEPARTMENT_OTHER): Payer: BC Managed Care – PPO

## 2022-11-22 ENCOUNTER — Other Ambulatory Visit: Payer: Self-pay

## 2022-11-22 ENCOUNTER — Encounter (HOSPITAL_BASED_OUTPATIENT_CLINIC_OR_DEPARTMENT_OTHER): Payer: Self-pay

## 2022-11-22 ENCOUNTER — Telehealth: Payer: Self-pay | Admitting: Medical

## 2022-11-22 ENCOUNTER — Emergency Department (HOSPITAL_BASED_OUTPATIENT_CLINIC_OR_DEPARTMENT_OTHER)
Admission: EM | Admit: 2022-11-22 | Discharge: 2022-11-22 | Disposition: A | Discharge status: Home / Self Care | Payer: BC Managed Care – PPO | Attending: Emergency Medicine | Admitting: Emergency Medicine

## 2022-11-22 DIAGNOSIS — Z79899 Other long term (current) drug therapy: Secondary | ICD-10-CM | POA: Diagnosis not present

## 2022-11-22 DIAGNOSIS — M545 Low back pain, unspecified: Secondary | ICD-10-CM | POA: Diagnosis not present

## 2022-11-22 DIAGNOSIS — Y92410 Unspecified street and highway as the place of occurrence of the external cause: Secondary | ICD-10-CM | POA: Diagnosis not present

## 2022-11-22 DIAGNOSIS — M5459 Other low back pain: Secondary | ICD-10-CM | POA: Diagnosis not present

## 2022-11-22 DIAGNOSIS — M47816 Spondylosis without myelopathy or radiculopathy, lumbar region: Secondary | ICD-10-CM | POA: Diagnosis not present

## 2022-11-22 DIAGNOSIS — I10 Essential (primary) hypertension: Secondary | ICD-10-CM | POA: Insufficient documentation

## 2022-11-22 MED ORDER — LIDOCAINE 5 % EX PTCH: 1.0000 | MEDICATED_PATCH | CUTANEOUS | 0 refills | Status: DC

## 2022-11-22 MED ORDER — CYCLOBENZAPRINE HCL 5 MG PO TABS: 5.0000 mg | ORAL_TABLET | Freq: Three times a day (TID) | ORAL | 0 refills | Status: DC | PRN

## 2022-11-22 MED ORDER — OXYCODONE-ACETAMINOPHEN 5-325 MG PO TABS
1.0000 | ORAL_TABLET | Freq: Once | ORAL | Status: AC
  Administered 2022-11-22: 1 via ORAL
  Filled 2022-11-22: qty 1
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
## 2022-11-22 NOTE — Progress Notes (Signed)
Results sent through MyChart
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
## 2022-11-22 NOTE — ED Notes (Signed)
D/c paperwork reviewed with pt, including prescriptions and follow up care.  No questions or concerns voiced at time of d/c. . Pt verbalized understanding, Ambulatory with family to ED exit, NAD.   

## 2022-11-22 NOTE — Telephone Encounter (Signed)
Patient called to hear lab results. Patient was told results and recommendations, no further questions. He talked to his insurance about Reginal Lutes and would like to pursue cheaper options. He was told a PA is needed but the [facility] rx would cost $1,000 out of pocket. Patient will contact insurance to see what weight loss medications are covered. Will call back with information.

## 2022-12-08 ENCOUNTER — Other Ambulatory Visit: Payer: Self-pay | Admitting: Medical

## 2022-12-08 MED ORDER — WEGOVY 0.25 MG/0.5ML ~~LOC~~ SOAJ: 0.2500 mg | SUBCUTANEOUS | 0 refills | Status: DC

## 2022-12-20 ENCOUNTER — Telehealth: Payer: Self-pay

## 2022-12-20 NOTE — Telephone Encounter (Signed)
[facility] Rx #: [id number] Drug: Wegovy 0.25MG /0.5ML auto-injectors Form: Valinda Hoar [place] [employer] Electronic Request Form Status: Wait for Determination

## 2022-12-24 NOTE — Telephone Encounter (Signed)
Called pt to notify. No answer, left voicemail to call back. Approval faxed to pharmacy on [address] f: [phone].

## 2022-12-24 NOTE — Telephone Encounter (Signed)
(  [employer] [employer]) Rx #: [id number] Drug: MJ.Hock 0.25MG /0.5ML auto-injectors Form: Valinda Hoar [place] [employer] Electronic  Your prior authorization for Reginal Lutes has been approved!  Message from plan: Approved.  Authorization Expiration Date: April 25, 2023.

## 2023-01-21 ENCOUNTER — Other Ambulatory Visit: Payer: Self-pay | Admitting: Internal Medicine

## 2023-01-21 MED ORDER — WEGOVY 0.25 MG/0.5ML ~~LOC~~ SOAJ: 0.2500 mg | SUBCUTANEOUS | 0 refills | Status: DC

## 2023-02-19 ENCOUNTER — Other Ambulatory Visit: Payer: Self-pay | Admitting: Medical

## 2023-02-19 MED ORDER — WEGOVY 1 MG/0.5ML ~~LOC~~ SOAJ: 1.0000 mg | SUBCUTANEOUS | 0 refills | Status: DC

## 2023-03-03 ENCOUNTER — Ambulatory Visit (INDEPENDENT_AMBULATORY_CARE_PROVIDER_SITE_OTHER): Payer: BC Managed Care – PPO | Admitting: Medical

## 2023-03-03 ENCOUNTER — Encounter: Payer: Self-pay | Admitting: Medical

## 2023-03-03 VITALS — BP 122/74 | HR 78 | Ht 67.0 in | Wt 394.0 lb

## 2023-03-03 DIAGNOSIS — R7301 Impaired fasting glucose: Secondary | ICD-10-CM

## 2023-03-03 DIAGNOSIS — Z23 Encounter for immunization: Secondary | ICD-10-CM | POA: Diagnosis not present

## 2023-03-03 DIAGNOSIS — Z6841 Body Mass Index (BMI) 40.0 and over, adult: Secondary | ICD-10-CM | POA: Insufficient documentation

## 2023-03-03 DIAGNOSIS — G4733 Obstructive sleep apnea (adult) (pediatric): Secondary | ICD-10-CM

## 2023-03-03 DIAGNOSIS — I10 Essential (primary) hypertension: Secondary | ICD-10-CM | POA: Diagnosis not present

## 2023-03-03 DIAGNOSIS — E785 Hyperlipidemia, unspecified: Secondary | ICD-10-CM

## 2023-03-03 MED ORDER — WEGOVY 2.4 MG/0.75ML ~~LOC~~ SOAJ: 2.4000 mg | SUBCUTANEOUS | 0 refills | Status: DC

## 2023-03-03 MED ORDER — WEGOVY 1.7 MG/0.75ML ~~LOC~~ SOAJ: 1.7000 mg | SUBCUTANEOUS | 0 refills | Status: DC

## 2023-03-03 NOTE — Progress Notes (Signed)
Subjective:  Chris Carlson is a 37 y.o. male who presents for Chief Complaint  Patient presents with   Medical Management of Chronic Issues    Med check, discuss weight loss     Here for follow up on weight loss efforts  Current exercise: Walking the neighborhood every night.   Dietary efforts: Has cut out caffeine, has cut out a lot of sugar, using more salads.    Medication: Currently on the wegovy 1mg  weekly  Any side effects of medication: none  Compliant with BP medication and cholesterol medicaiton without c/o.  No other aggravating or relieving factors.    No other c/o.  Past Medical History:  Diagnosis Date   Chronic headache    since childhood, no prior neurology consult   Depression    Hyperlipidemia    Hypertension    Obesity     Current Outpatient Medications on File Prior to Visit  Medication Sig Dispense Refill   atorvastatin (LIPITOR) 40 MG tablet Take 1 tablet (40 mg total) by mouth daily. 90 tablet 3   omeprazole (PRILOSEC) 40 MG capsule Take 1 capsule (40 mg total) by mouth daily. 90 capsule 3   Semaglutide-Weight Management (WEGOVY) 1 MG/0.5ML SOAJ Inject 1 mg into the skin once a week. 2 mL 0   valsartan-hydrochlorothiazide (DIOVAN-HCT) 80-12.5 MG tablet Take 1 tablet by mouth daily. 90 tablet 3   No current facility-administered medications on file prior to visit.    The following portions of the patient's history were reviewed and updated as appropriate: allergies, current medications, past family history, past medical history, past social history, past surgical history and problem list.  ROS Otherwise as in subjective above     Objective: BP 122/74   Pulse 78   Ht 5\' 7"  (1.702 m)   Wt (!) 394 lb (178.7 kg)   BMI 61.71 kg/m   Wt Readings from Last 3 Encounters:  03/03/23 (!) 394 lb (178.7 kg)  11/22/22 (!) 409 lb (185.5 kg)  11/20/22 (!) 409 lb (185.5 kg)   General appearance: alert, no distress, well developed, well  nourished Heart: RRR, normal S1, S2, no murmurs Lungs: CTA bilaterally, no wheezes, rhonchi, or rales Ext: no edema     Assessment: Encounter Diagnoses  Name Primary?   Essential hypertension, benign Yes   Needs flu shot    COVID-19 vaccine administered    Dyslipidemia    OSA (obstructive sleep apnea)    Impaired fasting blood sugar    BMI 60.0-69.9, adult (HCC)      Plan: HTN - continue current medication  Dyslipidemia - continue statin  Increase to [facility] 1.7mg  weekly for 1 months, then increase to 2.4mg  weekly.  Counseled on diet, limiting calories,  counseled on adding weight bearing exercise, and continue cardio.   Continue efforts to lose weight.  Counseled on the influenza virus vaccine.  Influenza vaccine given after consent obtained.  Counseled on the Covid virus vaccine.   Covid vaccine given after consent obtained.   Race was seen today for medical management of chronic issues.  Diagnoses and all orders for this visit:  Essential hypertension, benign  Needs flu shot -     Flu vaccine trivalent PF, 6mos and older(Flulaval,Afluria,Fluarix,Fluzone)  COVID-19 vaccine administered -     Pfizer Comirnaty Covid -19 Vaccine 59yrs and older  Dyslipidemia  OSA (obstructive sleep apnea)  Impaired fasting blood sugar  BMI 60.0-69.9, adult (HCC)  Other orders -     Semaglutide-Weight Management (WEGOVY) 1.7 MG/0.75ML  SOAJ; Inject 1.7 mg into the skin once a week. -     Semaglutide-Weight Management (WEGOVY) 2.4 MG/0.75ML SOAJ; Inject 2.4 mg into the skin once a week.    Follow up: 64mo

## 2023-05-05 ENCOUNTER — Encounter: Payer: Self-pay | Admitting: Medical

## 2023-05-05 ENCOUNTER — Ambulatory Visit (INDEPENDENT_AMBULATORY_CARE_PROVIDER_SITE_OTHER): Payer: BC Managed Care – PPO | Admitting: Medical

## 2023-05-05 VITALS — BP 126/76 | HR 87 | Ht 69.25 in | Wt 363.6 lb

## 2023-05-05 DIAGNOSIS — Z6841 Body Mass Index (BMI) 40.0 and over, adult: Secondary | ICD-10-CM | POA: Diagnosis not present

## 2023-05-05 DIAGNOSIS — I10 Essential (primary) hypertension: Secondary | ICD-10-CM

## 2023-05-05 DIAGNOSIS — K5909 Other constipation: Secondary | ICD-10-CM

## 2023-05-05 DIAGNOSIS — R110 Nausea: Secondary | ICD-10-CM

## 2023-05-05 DIAGNOSIS — R7301 Impaired fasting glucose: Secondary | ICD-10-CM | POA: Diagnosis not present

## 2023-05-05 DIAGNOSIS — E785 Hyperlipidemia, unspecified: Secondary | ICD-10-CM | POA: Diagnosis not present

## 2023-05-05 MED ORDER — ATORVASTATIN CALCIUM 40 MG PO TABS: 40.0000 mg | ORAL_TABLET | Freq: Every day | ORAL | 3 refills | Status: DC

## 2023-05-05 MED ORDER — OMEPRAZOLE 40 MG PO CPDR: 40.0000 mg | DELAYED_RELEASE_CAPSULE | Freq: Every day | ORAL | 3 refills | Status: AC

## 2023-05-05 MED ORDER — TRULANCE 3 MG PO TABS: 1.0000 | ORAL_TABLET | Freq: Every day | ORAL | 0 refills | Status: DC

## 2023-05-05 MED ORDER — VALSARTAN-HYDROCHLOROTHIAZIDE 80-12.5 MG PO TABS: 1.0000 | ORAL_TABLET | Freq: Every day | ORAL | 3 refills | Status: DC

## 2023-05-05 MED ORDER — WEGOVY 2.4 MG/0.75ML ~~LOC~~ SOAJ: 2.4000 mg | SUBCUTANEOUS | 1 refills | Status: DC

## 2023-05-05 MED ORDER — ONDANSETRON 4 MG PO TBDP
4.0000 mg | ORAL_TABLET | Freq: Three times a day (TID) | ORAL | 0 refills | Status: DC | PRN
Start: 2023-05-05 — End: 2024-01-15

## 2023-05-05 NOTE — Progress Notes (Signed)
 Subjective:  Chris Carlson is a 38 y.o. male who presents for Chief Complaint  Patient presents with   Medication Management    Needs something for nausea and constipation (has tried Miralax and probiotic)     Here for med check.  Doing well on Wegovy  except he does get some nausea and constipation but he has had this chronically anyhow.  He had the symptoms even at the lowest doses of Wegovy .  He is using MiraLAX to help with constipation but it does not work all that well.  He is using ginger chews over-the-counter to help with nausea  He is compliant with his blood pressure medicine  He is compliant on his cholesterol medicine without complaint  He needs all his medicines sent to new pharmacy or [facility] today  No other aggravating or relieving factors.    No other c/o.  Past Medical History:  Diagnosis Date   Chronic headache    since childhood, no prior neurology consult   Depression    Hyperlipidemia    Hypertension    Obesity    Current Outpatient Medications on File Prior to Visit  Medication Sig Dispense Refill   polyethylene glycol (MIRALAX / GLYCOLAX) 17 g packet Take 17 g by mouth daily.     Probiotic Product (PROBIOTIC ADVANCED PO) Take 1 capsule by mouth daily. Equate Probiotic ([employer] Brand)     No current facility-administered medications on file prior to visit.     The following portions of the patient's history were reviewed and updated as appropriate: allergies, current medications, past family history, past medical history, past social history, past surgical history and problem list.  ROS Otherwise as in subjective above  Objective: BP 126/76   Pulse 87   Ht 5' 9.25 (1.759 m)   Wt (!) 363 lb 9.6 oz (164.9 kg)   SpO2 96%   BMI 53.31 kg/m   Wt Readings from Last 3 Encounters:  05/05/23 (!) 363 lb 9.6 oz (164.9 kg)  03/03/23 (!) 394 lb (178.7 kg)  11/22/22 (!) 409 lb (185.5 kg)    General appearance: alert, no distress, well developed,  well nourished Neck: supple, no lymphadenopathy, no thyromegaly, no masses Heart: RRR, normal S1, S2, no murmurs Lungs: CTA bilaterally, no wheezes, rhonchi, or rales Abdomen: +bs, soft, non tender, non distended, no masses, no hepatomegaly, no splenomegaly Pulses: 2+ radial pulses, 2+ pedal pulses, normal cap refill Ext: no edema    Assessment: Encounter Diagnoses  Name Primary?   Impaired fasting blood sugar Yes   Dyslipidemia    Essential hypertension, benign    BMI 50.0-59.9, adult (HCC)    Chronic nausea    Chronic constipation      Plan: Congratulated him on his continued efforts at weight loss with the Wegovy .  Continue working on [employer] and exercise regularly.  Continue Wegovy  at the highest dose 2.4 mg weekly  Begin Zofran  as needed for nausea.  Begin Trulance  trial since MiraLAX does not quite help the way he wanted to.  Continue drinking at least 80 to 100 ounces of water a day and consider fiber supplement  Recommended possible consult with gastroenterology for chronic nausea and constipation.  He will consider this and let me know  Continue Lipitor 40 mg daily  Continue valsartan  HCT 80/12.5 mg daily  Follow-up in 3 to 4 months  Chris Carlson was seen today for medication management.  Diagnoses and all orders for this visit:  Impaired fasting blood sugar  Dyslipidemia  Essential  hypertension, benign  BMI 50.0-59.9, adult (HCC)  Chronic nausea  Chronic constipation  Other orders -     atorvastatin  (LIPITOR) 40 MG tablet; Take 1 tablet (40 mg total) by mouth daily. -     valsartan -hydrochlorothiazide  (DIOVAN -HCT) 80-12.5 MG tablet; Take 1 tablet by mouth daily. -     omeprazole  (PRILOSEC) 40 MG capsule; Take 1 capsule (40 mg total) by mouth daily. -     Semaglutide -Weight Management (WEGOVY ) 2.4 MG/0.75ML SOAJ; Inject 2.4 mg into the skin once a week. -     ondansetron  (ZOFRAN -ODT) 4 MG disintegrating tablet; Take 1 tablet (4 mg total) by mouth  every 8 (eight) hours as needed for nausea or vomiting. -     Plecanatide  (TRULANCE ) 3 MG TABS; Take 1 tablet (3 mg total) by mouth daily.    Follow up: 3-4 mo

## 2023-05-08 ENCOUNTER — Other Ambulatory Visit: Payer: Self-pay | Admitting: Medical

## 2023-06-06 DIAGNOSIS — I10 Essential (primary) hypertension: Secondary | ICD-10-CM | POA: Diagnosis not present

## 2023-09-02 ENCOUNTER — Encounter: Payer: BC Managed Care – PPO | Admitting: Medical

## 2023-10-24 ENCOUNTER — Other Ambulatory Visit: Payer: Self-pay

## 2023-10-24 ENCOUNTER — Emergency Department (HOSPITAL_BASED_OUTPATIENT_CLINIC_OR_DEPARTMENT_OTHER)
Admission: EM | Admit: 2023-10-24 | Discharge: 2023-10-24 | Disposition: A | Discharge status: Home / Self Care | Attending: Emergency Medicine | Admitting: Emergency Medicine

## 2023-10-24 ENCOUNTER — Emergency Department (HOSPITAL_BASED_OUTPATIENT_CLINIC_OR_DEPARTMENT_OTHER)

## 2023-10-24 ENCOUNTER — Encounter (HOSPITAL_BASED_OUTPATIENT_CLINIC_OR_DEPARTMENT_OTHER): Payer: Self-pay | Admitting: Emergency Medicine

## 2023-10-24 DIAGNOSIS — M7989 Other specified soft tissue disorders: Secondary | ICD-10-CM | POA: Insufficient documentation

## 2023-10-24 DIAGNOSIS — M79604 Pain in right leg: Secondary | ICD-10-CM | POA: Insufficient documentation

## 2023-10-24 DIAGNOSIS — M19071 Primary osteoarthritis, right ankle and foot: Secondary | ICD-10-CM | POA: Diagnosis not present

## 2023-10-24 MED ORDER — OXYCODONE HCL 5 MG PO TABS: 5.0000 mg | ORAL_TABLET | ORAL | 0 refills | Status: AC | PRN

## 2023-10-24 NOTE — Discharge Instructions (Signed)
 Follow-up with your primary care doctor.  Have also given the information for our on-call sports medicine doctor that she can follow-up with.  We discussed obtaining an ultrasound of your leg however the ultrasound team was gone for tonight.  We discussed returning tomorrow to have this done but you declined and so you would have this done by your primary care doctor.  I have sent in a short course of pain medicine for you.  X-ray did not show any concerning findings.  Hardware is in place.

## 2023-10-24 NOTE — ED Triage Notes (Signed)
 Patient coming to ED for evaluation of R leg swelling and pain x 1 month.  Reports he has rods and screw in my leg since 2014.  No problems prior to recently.  Concerned that the hardware in leg might have moved or is out of place. No reports of SHOB or increased swelling to L leg.

## 2023-10-24 NOTE — ED Provider Notes (Signed)
 [place] EMERGENCY DEPARTMENT AT [facility] Provider Note   CSN: [id number] Arrival date & time: 10/24/23  1925     Patient presents with: Leg Swelling and Leg Pain   Chris Carlson is a 38 y.o. male.   38 year old male presents today with his father-in-law for concern of right leg pain and swelling.  Ongoing for over a month.  History of fracture and hardware placement in 2013 in the right ankle.  This was done in and outside hospital.  Pain gets worse with ambulating.  Does have previous history of PE which was provoked by the ankle surgery.  Not currently on anticoagulation.  No unprovoked DVT or PE.  No recent long travel, recent surgery.  No trauma to the leg.  The history is provided by the patient. No language interpreter was used.       Prior to Admission medications   Medication Sig Start Date End Date Taking? Authorizing Provider  oxyCODONE  (ROXICODONE ) 5 MG immediate release tablet Take 1 tablet (5 mg total) by mouth every 4 (four) hours as needed for severe pain (pain score 7-10). 10/24/23  Yes Hildegard, Latavious Bitter, PA-C  atorvastatin  (LIPITOR) 40 MG tablet Take 1 tablet (40 mg total) by mouth daily. 05/05/23 08/03/23  Tysinger, Alm RAMAN, PA-C  omeprazole  (PRILOSEC) 40 MG capsule Take 1 capsule (40 mg total) by mouth daily. 05/05/23   Tysinger, Alm RAMAN, PA-C  ondansetron  (ZOFRAN -ODT) 4 MG disintegrating tablet Take 1 tablet (4 mg total) by mouth every 8 (eight) hours as needed for nausea or vomiting. 05/05/23   Tysinger, Alm RAMAN, PA-C  Plecanatide  (TRULANCE ) 3 MG TABS Take 1 tablet (3 mg total) by mouth daily. 05/05/23   Tysinger, Alm RAMAN, PA-C  polyethylene glycol (MIRALAX / GLYCOLAX) 17 g packet Take 17 g by mouth daily.    [provider]  Probiotic Product (PROBIOTIC ADVANCED PO) Take 1 capsule by mouth daily. Equate Probiotic ([employer] Brand)    [provider]  Semaglutide -Weight Management (WEGOVY ) 2.4 MG/0.75ML SOAJ Inject 2.4 mg into the skin once a week.  05/05/23   Tysinger, Alm RAMAN, PA-C  valsartan -hydrochlorothiazide  (DIOVAN -HCT) 80-12.5 MG tablet Take 1 tablet by mouth daily. 05/05/23   Tysinger, Alm RAMAN, PA-C    Allergies: Patient has no known allergies.    Review of Systems  Constitutional:  Negative for chills and fever.  Respiratory:  Negative for shortness of breath.   Cardiovascular:  Negative for chest pain.  Musculoskeletal:  Positive for arthralgias.  Neurological:  Negative for light-headedness.  All other systems reviewed and are negative.   Updated Vital Signs BP (!) 137/90   Pulse 88   Temp 98 F (36.7 C)   Resp 20   Ht 5' 8 (1.727 m)   Wt (!) 172.4 kg   SpO2 97%   BMI 57.78 kg/m   Physical Exam Vitals and nursing note reviewed.  Constitutional:      General: He is not in acute distress.    Appearance: Normal appearance. He is not ill-appearing.  HENT:     Head: Normocephalic and atraumatic.     Nose: Nose normal.   Eyes:     Conjunctiva/sclera: Conjunctivae normal.   Pulmonary:     Effort: Pulmonary effort is normal. No respiratory distress.   Musculoskeletal:        General: No deformity.     Comments: No asymmetrical swelling.  Neurovascularly intact.  No tenderness palpation of the ankle, tib-fib, or right knee.  Good range of  motion in bilateral knees.   Skin:    Findings: No rash.   Neurological:     Mental Status: He is alert.     (all labs ordered are listed, but only abnormal results are displayed) Labs Reviewed - No data to display  EKG: None  Radiology: DG Ankle Complete Right Result Date: 10/24/2023 CLINICAL DATA:  Right leg swelling and pain for 1 month. EXAM: RIGHT ANKLE - COMPLETE 3+ VIEW; RIGHT TIBIA AND FIBULA - 2 VIEW COMPARISON:  None Available. FINDINGS: No acute fracture or dislocation. Plate and screw fixation of the distal fibula and tibia. Fracture of the middle of the distal fibular plate at the fracture line. The fracture line remains visible in the distal right  fibular diaphysis with adjacent bony callus formation. Degenerative changes about the ankle. Diffuse soft tissue swelling about the ankle. IMPRESSION: 1. No acute fracture or dislocation. 2. 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 Age indeterminate fracture of the distal fibular plate at the fracture line. 3. Plate and screw fixation of the distal tibia. Electronically Signed   By: Norman Gatlin M.D.   On: 10/24/2023 20:25   DG Tibia/Fibula Right Result Date: 10/24/2023 CLINICAL DATA:  Right leg swelling and pain for 1 month. EXAM: RIGHT ANKLE - COMPLETE 3+ VIEW; RIGHT TIBIA AND FIBULA - 2 VIEW COMPARISON:  None Available. FINDINGS: No acute fracture or dislocation. Plate and screw fixation of the distal fibula and tibia. Fracture of the middle of the distal fibular plate at the fracture line. The fracture line remains visible in the distal right fibular diaphysis with adjacent bony callus formation. Degenerative changes about the ankle. Diffuse soft tissue swelling about the ankle. IMPRESSION: 1. No acute fracture or dislocation. 2. 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 Age indeterminate fracture of the distal fibular plate at the fracture line. 3. Plate and screw fixation of the distal tibia. Electronically Signed   By: Norman Gatlin M.D.   On: 10/24/2023 20:25     Procedures   Medications Ordered in the ED - No data to display                                  Medical Decision Making Amount and/or Complexity of Data Reviewed Radiology: ordered.  Risk Prescription drug management.   38 year old male presents today for concern of right leg pain and swelling.  No visible swelling on exam.  Bilateral lower extremities are symmetrical in size.  All joints are with good range of motion and without tenderness to palpation.  X-rays obtained.  No bony abnormality.  Hardware seems to be in place.  We did discuss DVT study.  Ultrasound tech has left for tonight.  I offered to  place this order for tomorrow however he declines and states he will follow-up with his PCP to have this performed but does not want to have this placed to have done tomorrow through the ED.  Will also give sports medicine referral.  Discharged in stable condition.  Return precaution discussed.  Patient voices understanding and is in agreement with plan.   Final diagnoses:  Right leg pain    ED Discharge Orders          Ordered    oxyCODONE  (ROXICODONE ) 5 MG immediate release tablet  Every 4 hours PRN        10/24/23 2158  Hildegard Loge, PA-C 10/24/23 2214    Armenta Canning, MD 11/06/23 [phone]

## 2023-10-28 ENCOUNTER — Ambulatory Visit (INDEPENDENT_AMBULATORY_CARE_PROVIDER_SITE_OTHER): Admitting: Sports Medicine

## 2023-10-28 ENCOUNTER — Encounter: Payer: Self-pay | Admitting: Sports Medicine

## 2023-10-28 VITALS — BP 124/86 | Ht 68.0 in | Wt 380.0 lb

## 2023-10-28 DIAGNOSIS — S82831P Other fracture of upper and lower end of right fibula, subsequent encounter for closed fracture with malunion: Secondary | ICD-10-CM | POA: Diagnosis not present

## 2023-10-28 NOTE — Progress Notes (Signed)
   Subjective:    Patient ID: Chris Carlson, male    DOB: [birth year], 38 y.o.   MRN: [id number]  HPI chief complaint: Right ankle pain and leg swelling  Patient is a very pleasant 38 year old male that presents today with several weeks of lateral right ankle pain and leg swelling.  He is status post ORIF in 2013.  He has always had mild pain along the distal fibula but it has gotten worse over the past several weeks.  He notices pain with direct pressure over the area as well as with ankle motion.  Pain is causing him to limp.  He was seen in the emergency room on June 27 and x-rays of his ankle at that time shows a chronic malunited distal fibular diaphysis fracture with an overlying fracture of the fibular plate.  Past medical history reviewed Medications reviewed Allergies reviewed  Review of Systems As above    Objective:   Physical Exam  Well-developed, well-nourished.  No acute distress  Right ankle: There is diffuse soft tissue swelling around the ankle.  He is tender to palpation along the distal fibula and lateral hardware. He has reproducible pain with dorsiflexion and inversion.  Good pulses.  Walking with an obvious limp.  X-rays of the right ankle are as above      Assessment & Plan:   Right ankle pain and swelling secondary to chronic malunited distal fibular fracture versus symptomatic hardware  Referral to Dr. Medford Pae for further workup and treatment.  He is provided with a pair of crutches to help assist with ambulation until that consultation.  He will follow-up with me as needed.  This note was dictated using Dragon naturally speaking software and may contain errors in syntax, spelling, or content which have not been identified prior to signing this note.

## 2023-11-11 NOTE — Addendum Note (Signed)
 Addended by: MARCINE HARLENE SAILOR on: 11/11/2023 09:19 AM   Modules accepted: Orders

## 2023-11-14 DIAGNOSIS — M19071 Primary osteoarthritis, right ankle and foot: Secondary | ICD-10-CM | POA: Diagnosis not present

## 2023-11-17 ENCOUNTER — Other Ambulatory Visit: Payer: Self-pay | Admitting: Orthopaedic Surgery

## 2023-11-24 ENCOUNTER — Encounter: Payer: BC Managed Care – PPO | Admitting: Medical

## 2023-11-28 NOTE — Pre-Procedure Instructions (Signed)
 Surgical Instructions   Your procedure is scheduled on December 09, 2023. Report to [facility] Main Entrance A at 10:30 A.M., then check in with the Admitting office. Any questions or running late day of surgery: call [phone]  Questions prior to your surgery date: call [phone], Monday-Friday, 8am-4pm. If you experience any cold or flu symptoms such as cough, fever, chills, shortness of breath, etc. between now and your scheduled surgery, please notify us  at the above number.     Remember:  Do not eat after midnight the night before your surgery   You may drink clear liquids until 9:30 AM the morning of your surgery.   Clear liquids allowed are: Water, Non-Citrus Juices (without pulp), Carbonated Beverages, Clear Tea (no milk, honey, etc.), Black Coffee Only (NO MILK, CREAM OR POWDERED CREAMER of any kind), and Gatorade.  Patient Instructions  The night before surgery:  No food after midnight. ONLY clear liquids after midnight  The day of surgery (if you do NOT have diabetes):  Drink ONE (1) Pre-Surgery Clear Ensure by 9:30 AM the morning of surgery. Drink in one sitting. Do not sip.  This drink was given to you during your hospital  pre-op appointment visit.  Nothing else to drink after completing the  Pre-Surgery Clear Ensure.         If you have questions, please contact your surgeon's office.    Take these medicines the morning of surgery with A SIP OF WATER: NONE   May take these medicines IF NEEDED: acetaminophen  (TYLENOL )    STOP taking your Semaglutide -Weight Management (WEGOVY ) one week prior to surgery. DO NOT take any doses after August 4th.   One week prior to surgery, STOP taking any Aspirin (unless otherwise instructed by your surgeon) Aleve , Naproxen , Ibuprofen, Motrin, Advil, Goody's, BC's, all herbal medications, fish oil, and non-prescription vitamins.                     Do NOT Smoke (Tobacco/Vaping) for 24 hours prior to your  procedure.  If you use a CPAP at night, you may bring your mask/headgear for your overnight stay.   You will be asked to remove any contacts, glasses, piercing's, hearing aid's, dentures/partials prior to surgery. Please bring cases for these items if needed.    Patients discharged the day of surgery will not be allowed to drive home, and someone needs to stay with them for 24 hours.  SURGICAL WAITING ROOM VISITATION Patients may have no more than 2 support people in the waiting area - these visitors may rotate.   Pre-op nurse will coordinate an appropriate time for 1 ADULT support person, who may not rotate, to accompany patient in pre-op.  Children under the age of 19 must have an adult with them who is not the patient and must remain in the main waiting area with an adult.  If the patient needs to stay at the hospital during part of their recovery, the visitor guidelines for inpatient rooms apply.  Please refer to the [facility] website for the visitor guidelines for any additional information.   If you received a COVID test during your pre-op visit  it is requested that you wear a mask when out in public, stay away from anyone that may not be feeling well and notify your surgeon if you develop symptoms. If you have been in contact with anyone that has tested positive in the last 10 days please notify you surgeon.  Pre-operative CHG Bathing Instructions   You can play a key role in reducing the risk of infection after surgery. Your skin needs to be as free of germs as possible. You can reduce the number of germs on your skin by washing with CHG (chlorhexidine gluconate) soap before surgery. CHG is an antiseptic soap that kills germs and continues to kill germs even after washing.   DO NOT use if you have an allergy to chlorhexidine/CHG or antibacterial soaps. If your skin becomes reddened or irritated, stop using the CHG and notify one of our RNs at [phone].               TAKE A SHOWER THE NIGHT BEFORE SURGERY AND THE DAY OF SURGERY    Please keep in mind the following:  DO NOT shave, including legs and underarms, 48 hours prior to surgery.   You may shave your face before/day of surgery.  Place clean sheets on your bed the night before surgery Use a clean washcloth (not used since being washed) for each shower. DO NOT sleep with pet's night before surgery.  CHG Shower Instructions:  Wash your face and private area with normal soap. If you choose to wash your hair, wash first with your normal shampoo.  After you use shampoo/soap, rinse your hair and body thoroughly to remove shampoo/soap residue.  Turn the water OFF and apply half the bottle of CHG soap to a CLEAN washcloth.  Apply CHG soap ONLY FROM YOUR NECK DOWN TO YOUR TOES (washing for 3-5 minutes)  DO NOT use CHG soap on face, private areas, open wounds, or sores.  Pay special attention to the area where your surgery is being performed.  If you are having back surgery, having someone wash your back for you may be helpful. Wait 2 minutes after CHG soap is applied, then you may rinse off the CHG soap.  Pat dry with a clean towel  Put on clean pajamas    Additional instructions for the day of surgery: DO NOT APPLY any lotions, deodorants, cologne, or perfumes.   Do not wear jewelry or makeup Do not wear nail polish, gel polish, artificial nails, or any other type of covering on natural nails (fingers and toes) Do not bring valuables to the hospital. [facility] is not responsible for valuables/personal belongings. Put on clean/comfortable clothes.  Please brush your teeth.  Ask your nurse before applying any prescription medications to the skin.

## 2023-12-01 ENCOUNTER — Encounter (HOSPITAL_COMMUNITY)
Admission: RE | Admit: 2023-12-01 | Discharge: 2023-12-01 | Disposition: A | Discharge status: Home / Self Care | Source: Ambulatory Visit | Attending: Orthopaedic Surgery | Admitting: Orthopaedic Surgery

## 2023-12-01 ENCOUNTER — Encounter (HOSPITAL_COMMUNITY): Payer: Self-pay

## 2023-12-01 ENCOUNTER — Other Ambulatory Visit: Payer: Self-pay

## 2023-12-01 VITALS — BP 129/94 | HR 90 | Temp 98.2°F | Resp 19 | Ht 69.0 in | Wt >= 6400 oz

## 2023-12-01 DIAGNOSIS — Z01818 Encounter for other preprocedural examination: Secondary | ICD-10-CM | POA: Diagnosis not present

## 2023-12-01 DIAGNOSIS — Z01812 Encounter for preprocedural laboratory examination: Secondary | ICD-10-CM | POA: Diagnosis not present

## 2023-12-01 DIAGNOSIS — I2510 Atherosclerotic heart disease of native coronary artery without angina pectoris: Secondary | ICD-10-CM | POA: Diagnosis not present

## 2023-12-01 DIAGNOSIS — Z01810 Encounter for preprocedural cardiovascular examination: Secondary | ICD-10-CM | POA: Diagnosis not present

## 2023-12-01 HISTORY — DX: Unspecified osteoarthritis, unspecified site: M19.90

## 2023-12-01 HISTORY — DX: Cardiac arrhythmia, unspecified: I49.9

## 2023-12-01 LAB — CBC
HCT: 39.9 % (ref 39.0–52.0)
Hemoglobin: 12.4 g/dL — ABNORMAL LOW (ref 13.0–17.0)
MCH: 23.3 pg — ABNORMAL LOW (ref 26.0–34.0)
MCHC: 31.1 g/dL (ref 30.0–36.0)
MCV: 75 fL — ABNORMAL LOW (ref 80.0–100.0)
Platelets: 253 K/uL (ref 150–400)
RBC: 5.32 MIL/uL (ref 4.22–5.81)
RDW: 15.9 % — ABNORMAL HIGH (ref 11.5–15.5)
WBC: 6.1 K/uL (ref 4.0–10.5)
nRBC: 0 % (ref 0.0–0.2)

## 2023-12-01 LAB — BASIC METABOLIC PANEL WITH GFR
Anion gap: 8 (ref 5–15)
BUN: 11 mg/dL (ref 6–20)
CO2: 24 mmol/L (ref 22–32)
Calcium: 9 mg/dL (ref 8.9–10.3)
Chloride: 105 mmol/L (ref 98–111)
Creatinine, Ser: 1.05 mg/dL (ref 0.61–1.24)
GFR, Estimated: >60 mL/min (ref >60)
Glucose, Bld: 104 mg/dL — ABNORMAL HIGH (ref 70–99)
Potassium: 3.8 mmol/L (ref 3.5–5.1)
Sodium: 137 mmol/L (ref 135–145)

## 2023-12-01 NOTE — Progress Notes (Signed)
 PCP - Alm Gent, PA-C Cardiologist - Pt saw Dr. Lynwood Schilling in 2023 for CP/Palpitations. CT Calcium  complete with score of zero. Follow-up was supposed to be in 12 months, but pt denies any chest pain or palpitations since that time.  PPM/ICD - Denies Device Orders - n/a Rep Notified - n/a  Chest x-ray - n/a EKG - 12/01/2023 Stress Test - Denies ECHO - Denies Cardiac Cath - Denies CT Calcium  - 05/24/2021  Sleep Study - Denies CPAP - n/a  No DM  Last dose of GLP1 agonist- n/a - pt has not been on Wegovy  since last year due to insurance costs. GLP1 instructions: Pt instructed to not restart medication prior to surgery  Blood Thinner Instructions: n/a Aspirin Instructions: n/a  ERAS Protcol - Clear liquids until 0930 morning of surgery PRE-SURGERY Ensure or G2- Ensure given to pt with instructions  COVID TEST- n/a   Anesthesia review: No   Patient denies shortness of breath, fever, cough and chest pain at PAT appointment. Pt denies any respiratory illness/infection in the last two months.   All instructions explained to the patient, with a verbal understanding of the material. Patient agrees to go over the instructions while at home for a better understanding. Patient also instructed to self quarantine after being tested for COVID-19. The opportunity to ask questions was provided.

## 2023-12-09 ENCOUNTER — Ambulatory Visit (HOSPITAL_COMMUNITY): Admitting: Certified Registered Nurse Anesthetist

## 2023-12-09 ENCOUNTER — Other Ambulatory Visit: Payer: Self-pay

## 2023-12-09 ENCOUNTER — Encounter (HOSPITAL_COMMUNITY)
Admission: RE | Disposition: A | Discharge status: Home / Self Care | Payer: Self-pay | Source: Home / Self Care | Attending: Orthopaedic Surgery

## 2023-12-09 ENCOUNTER — Observation Stay (HOSPITAL_COMMUNITY)
Admission: RE | Admit: 2023-12-09 | Discharge: 2023-12-10 | Disposition: A | Discharge status: Home / Self Care | Attending: Orthopaedic Surgery | Admitting: Orthopaedic Surgery

## 2023-12-09 ENCOUNTER — Encounter (HOSPITAL_COMMUNITY): Payer: Self-pay | Admitting: Orthopaedic Surgery

## 2023-12-09 ENCOUNTER — Ambulatory Visit (HOSPITAL_COMMUNITY)

## 2023-12-09 DIAGNOSIS — T84196A Other mechanical complication of internal fixation device of bone of right lower leg, initial encounter: Secondary | ICD-10-CM | POA: Diagnosis not present

## 2023-12-09 DIAGNOSIS — M25571 Pain in right ankle and joints of right foot: Secondary | ICD-10-CM | POA: Diagnosis not present

## 2023-12-09 DIAGNOSIS — Z79899 Other long term (current) drug therapy: Secondary | ICD-10-CM | POA: Insufficient documentation

## 2023-12-09 DIAGNOSIS — T8484XA Pain due to internal orthopedic prosthetic devices, implants and grafts, initial encounter: Secondary | ICD-10-CM | POA: Diagnosis not present

## 2023-12-09 DIAGNOSIS — M19071 Primary osteoarthritis, right ankle and foot: Secondary | ICD-10-CM | POA: Diagnosis not present

## 2023-12-09 DIAGNOSIS — Z7982 Long term (current) use of aspirin: Secondary | ICD-10-CM | POA: Insufficient documentation

## 2023-12-09 DIAGNOSIS — I10 Essential (primary) hypertension: Secondary | ICD-10-CM | POA: Diagnosis not present

## 2023-12-09 DIAGNOSIS — G8918 Other acute postprocedural pain: Secondary | ICD-10-CM | POA: Diagnosis not present

## 2023-12-09 DIAGNOSIS — Y792 Prosthetic and other implants, materials and accessory orthopedic devices associated with adverse incidents: Secondary | ICD-10-CM | POA: Diagnosis not present

## 2023-12-09 DIAGNOSIS — Z4789 Encounter for other orthopedic aftercare: Secondary | ICD-10-CM | POA: Diagnosis not present

## 2023-12-09 HISTORY — PX: ANKLE FUSION: SHX881

## 2023-12-09 HISTORY — PX: ACHILLES TENDON LENGTHENING: SHX6455

## 2023-12-09 HISTORY — PX: HARDWARE REMOVAL: SHX979

## 2023-12-09 LAB — CBC
HCT: 34.3 % — ABNORMAL LOW (ref 39.0–52.0)
Hemoglobin: 10.9 g/dL — ABNORMAL LOW (ref 13.0–17.0)
MCH: 23.6 pg — ABNORMAL LOW (ref 26.0–34.0)
MCHC: 31.8 g/dL (ref 30.0–36.0)
MCV: 74.2 fL — ABNORMAL LOW (ref 80.0–100.0)
Platelets: 224 K/uL (ref 150–400)
RBC: 4.62 MIL/uL (ref 4.22–5.81)
RDW: 15.7 % — ABNORMAL HIGH (ref 11.5–15.5)
WBC: 10 K/uL (ref 4.0–10.5)
nRBC: 0 % (ref 0.0–0.2)

## 2023-12-09 LAB — CREATININE, SERUM
Creatinine, Ser: 1.18 mg/dL (ref 0.61–1.24)
GFR, Estimated: >60 mL/min (ref >60)

## 2023-12-09 SURGERY — REMOVAL, HARDWARE
Anesthesia: General | Site: Ankle | Laterality: Right

## 2023-12-09 MED ORDER — PHENYLEPHRINE 80 MCG/ML (10ML) SYRINGE FOR IV PUSH (FOR BLOOD PRESSURE SUPPORT)
PREFILLED_SYRINGE | INTRAVENOUS | Status: AC
  Filled 2023-12-09: qty 10

## 2023-12-09 MED ORDER — ROCURONIUM BROMIDE 10 MG/ML (PF) SYRINGE
PREFILLED_SYRINGE | INTRAVENOUS | Status: AC
  Filled 2023-12-09: qty 10

## 2023-12-09 MED ORDER — POVIDONE-IODINE 7.5 % EX SOLN
Freq: Once | CUTANEOUS | Status: AC
  Filled 2023-12-09: qty 118

## 2023-12-09 MED ORDER — LACTATED RINGERS IV SOLN: INTRAVENOUS | Status: DC

## 2023-12-09 MED ORDER — OXYCODONE HCL 5 MG/5ML PO SOLN: 5.0000 mg | Freq: Once | ORAL | Status: DC | PRN

## 2023-12-09 MED ORDER — ROCURONIUM BROMIDE 10 MG/ML (PF) SYRINGE
PREFILLED_SYRINGE | INTRAVENOUS | Status: DC | PRN
  Administered 2023-12-09: 70 mg via INTRAVENOUS
  Administered 2023-12-09 (×4): 20 mg via INTRAVENOUS
  Administered 2023-12-09 (×4): 30 mg via INTRAVENOUS
  Administered 2023-12-09: 70 mg via INTRAVENOUS
  Administered 2023-12-09 (×2): 20 mg via INTRAVENOUS

## 2023-12-09 MED ORDER — PROPOFOL 10 MG/ML IV BOLUS
INTRAVENOUS | Status: AC
  Filled 2023-12-09: qty 20

## 2023-12-09 MED ORDER — PROPOFOL 10 MG/ML IV BOLUS
INTRAVENOUS | Status: DC | PRN
  Administered 2023-12-09 (×2): 300 mg via INTRAVENOUS

## 2023-12-09 MED ORDER — BUPIVACAINE HCL (PF) 0.25 % IJ SOLN
INTRAMUSCULAR | Status: AC
  Filled 2023-12-09: qty 30

## 2023-12-09 MED ORDER — DEXAMETHASONE SODIUM PHOSPHATE 10 MG/ML IJ SOLN
INTRAMUSCULAR | Status: DC | PRN
  Administered 2023-12-09 (×2): 10 mg via INTRAVENOUS

## 2023-12-09 MED ORDER — METHOCARBAMOL 1000 MG/10ML IJ SOLN: 500.0000 mg | Freq: Four times a day (QID) | INTRAMUSCULAR | Status: DC | PRN

## 2023-12-09 MED ORDER — KETOROLAC TROMETHAMINE 30 MG/ML IJ SOLN
INTRAMUSCULAR | Status: AC
  Filled 2023-12-09: qty 1

## 2023-12-09 MED ORDER — CHLORHEXIDINE GLUCONATE 0.12 % MT SOLN
15.0000 mL | Freq: Once | OROMUCOSAL | Status: AC
  Administered 2023-12-09 (×2): 15 mL via OROMUCOSAL
  Filled 2023-12-09: qty 15

## 2023-12-09 MED ORDER — FENTANYL CITRATE (PF) 100 MCG/2ML IJ SOLN
INTRAMUSCULAR | Status: AC
  Administered 2023-12-09 (×2): 100 ug via INTRAVENOUS
  Filled 2023-12-09: qty 2

## 2023-12-09 MED ORDER — ACETAMINOPHEN 500 MG PO TABS
1000.0000 mg | ORAL_TABLET | Freq: Once | ORAL | Status: AC
  Administered 2023-12-09 (×2): 1000 mg via ORAL
  Filled 2023-12-09: qty 2

## 2023-12-09 MED ORDER — LIDOCAINE 2% (20 MG/ML) 5 ML SYRINGE
INTRAMUSCULAR | Status: DC | PRN
  Administered 2023-12-09 (×2): 60 mg via INTRAVENOUS

## 2023-12-09 MED ORDER — OXYCODONE HCL 5 MG PO TABS
5.0000 mg | ORAL_TABLET | ORAL | Status: DC | PRN
  Administered 2023-12-10 (×2): 5 mg via ORAL
  Filled 2023-12-09: qty 1

## 2023-12-09 MED ORDER — KETOROLAC TROMETHAMINE 30 MG/ML IJ SOLN
30.0000 mg | Freq: Once | INTRAMUSCULAR | Status: AC | PRN
  Administered 2023-12-09 (×2): 30 mg via INTRAVENOUS

## 2023-12-09 MED ORDER — ONDANSETRON HCL 4 MG/2ML IJ SOLN
INTRAMUSCULAR | Status: AC
  Filled 2023-12-09: qty 6

## 2023-12-09 MED ORDER — ONDANSETRON HCL 4 MG/2ML IJ SOLN: 4.0000 mg | Freq: Once | INTRAMUSCULAR | Status: DC | PRN

## 2023-12-09 MED ORDER — FENTANYL CITRATE (PF) 250 MCG/5ML IJ SOLN
INTRAMUSCULAR | Status: DC | PRN
  Administered 2023-12-09 (×2): 50 ug via INTRAVENOUS
  Administered 2023-12-09: 100 ug via INTRAVENOUS
  Administered 2023-12-09: 50 ug via INTRAVENOUS
  Administered 2023-12-09: 100 ug via INTRAVENOUS
  Administered 2023-12-09 (×3): 50 ug via INTRAVENOUS

## 2023-12-09 MED ORDER — AMISULPRIDE (ANTIEMETIC) 5 MG/2ML IV SOLN: 10.0000 mg | Freq: Once | INTRAVENOUS | Status: DC | PRN

## 2023-12-09 MED ORDER — SUCCINYLCHOLINE CHLORIDE 200 MG/10ML IV SOSY
PREFILLED_SYRINGE | INTRAVENOUS | Status: AC
  Filled 2023-12-09: qty 10

## 2023-12-09 MED ORDER — BUPIVACAINE HCL (PF) 0.25 % IJ SOLN
INTRAMUSCULAR | Status: DC | PRN
  Administered 2023-12-09 (×2): 30 mL

## 2023-12-09 MED ORDER — CEFAZOLIN SODIUM-DEXTROSE 3-4 GM/150ML-% IV SOLN
3.0000 g | INTRAVENOUS | Status: AC
  Administered 2023-12-09 (×2): 3 g via INTRAVENOUS
  Filled 2023-12-09: qty 150

## 2023-12-09 MED ORDER — HYDROMORPHONE HCL 1 MG/ML IJ SOLN
0.5000 mg | INTRAMUSCULAR | Status: DC | PRN
  Administered 2023-12-09 (×4): 1 mg via INTRAVENOUS
  Filled 2023-12-09 (×2): qty 1

## 2023-12-09 MED ORDER — ACETAMINOPHEN 325 MG PO TABS
650.0000 mg | ORAL_TABLET | Freq: Four times a day (QID) | ORAL | Status: DC
  Administered 2023-12-09 – 2023-12-10 (×6): 650 mg via ORAL
  Filled 2023-12-09 (×3): qty 2

## 2023-12-09 MED ORDER — OXYCODONE HCL 5 MG PO TABS: 5.0000 mg | ORAL_TABLET | Freq: Once | ORAL | Status: DC | PRN

## 2023-12-09 MED ORDER — ACETAMINOPHEN 325 MG PO TABS: 325.0000 mg | ORAL_TABLET | Freq: Four times a day (QID) | ORAL | Status: DC | PRN

## 2023-12-09 MED ORDER — HYDROMORPHONE HCL 1 MG/ML IJ SOLN
INTRAMUSCULAR | Status: AC
  Filled 2023-12-09: qty 1

## 2023-12-09 MED ORDER — OXYCODONE HCL 5 MG PO TABS
10.0000 mg | ORAL_TABLET | ORAL | Status: DC | PRN
  Administered 2023-12-09 (×2): 10 mg via ORAL
  Filled 2023-12-09: qty 2

## 2023-12-09 MED ORDER — ONDANSETRON HCL 4 MG PO TABS: 4.0000 mg | ORAL_TABLET | Freq: Four times a day (QID) | ORAL | Status: DC | PRN

## 2023-12-09 MED ORDER — FENTANYL CITRATE (PF) 100 MCG/2ML IJ SOLN: 100.0000 ug | Freq: Once | INTRAMUSCULAR | Status: AC

## 2023-12-09 MED ORDER — ONDANSETRON HCL 4 MG/2ML IJ SOLN
INTRAMUSCULAR | Status: DC | PRN
  Administered 2023-12-09 (×2): 4 mg via INTRAVENOUS

## 2023-12-09 MED ORDER — PHENYLEPHRINE 80 MCG/ML (10ML) SYRINGE FOR IV PUSH (FOR BLOOD PRESSURE SUPPORT)
PREFILLED_SYRINGE | INTRAVENOUS | Status: DC | PRN
  Administered 2023-12-09 (×8): 80 ug via INTRAVENOUS

## 2023-12-09 MED ORDER — ROCURONIUM BROMIDE 10 MG/ML (PF) SYRINGE
PREFILLED_SYRINGE | INTRAVENOUS | Status: AC
Start: 2023-12-09 — End: 2023-12-09
  Filled 2023-12-09: qty 10

## 2023-12-09 MED ORDER — POVIDONE-IODINE 10 % EX SWAB
2.0000 | Freq: Once | CUTANEOUS | Status: AC
  Administered 2023-12-09 (×2): 2 via TOPICAL

## 2023-12-09 MED ORDER — LIDOCAINE 2% (20 MG/ML) 5 ML SYRINGE
INTRAMUSCULAR | Status: AC
  Filled 2023-12-09: qty 5

## 2023-12-09 MED ORDER — METOCLOPRAMIDE HCL 5 MG PO TABS: 5.0000 mg | ORAL_TABLET | Freq: Three times a day (TID) | ORAL | Status: DC | PRN

## 2023-12-09 MED ORDER — PHENYLEPHRINE HCL-NACL 20-0.9 MG/250ML-% IV SOLN
INTRAVENOUS | Status: DC | PRN
Start: 2023-12-09 — End: 2023-12-09
  Administered 2023-12-09 (×2): 25 ug/min via INTRAVENOUS

## 2023-12-09 MED ORDER — FENTANYL CITRATE (PF) 250 MCG/5ML IJ SOLN
INTRAMUSCULAR | Status: AC
  Filled 2023-12-09: qty 5

## 2023-12-09 MED ORDER — DIPHENHYDRAMINE HCL 12.5 MG/5ML PO ELIX: 12.5000 mg | ORAL_SOLUTION | ORAL | Status: DC | PRN

## 2023-12-09 MED ORDER — CEFAZOLIN SODIUM-DEXTROSE 2-4 GM/100ML-% IV SOLN
2.0000 g | Freq: Four times a day (QID) | INTRAVENOUS | Status: AC
  Administered 2023-12-09 (×4): 2 g via INTRAVENOUS
  Filled 2023-12-09 (×2): qty 100

## 2023-12-09 MED ORDER — NAPROXEN 250 MG PO TABS
250.0000 mg | ORAL_TABLET | Freq: Two times a day (BID) | ORAL | Status: DC
  Administered 2023-12-09 – 2023-12-10 (×4): 250 mg via ORAL
  Filled 2023-12-09 (×4): qty 1

## 2023-12-09 MED ORDER — ALBUMIN HUMAN 5 % IV SOLN: INTRAVENOUS | Status: DC | PRN

## 2023-12-09 MED ORDER — DOCUSATE SODIUM 100 MG PO CAPS
100.0000 mg | ORAL_CAPSULE | Freq: Two times a day (BID) | ORAL | Status: DC
  Administered 2023-12-09 – 2023-12-10 (×4): 100 mg via ORAL
  Filled 2023-12-09 (×2): qty 1

## 2023-12-09 MED ORDER — MEPERIDINE HCL 25 MG/ML IJ SOLN: 6.2500 mg | INTRAMUSCULAR | Status: DC | PRN

## 2023-12-09 MED ORDER — DEXMEDETOMIDINE HCL IN NACL 80 MCG/20ML IV SOLN
INTRAVENOUS | Status: DC | PRN
  Administered 2023-12-09: 12 ug via INTRAVENOUS
  Administered 2023-12-09 (×3): 8 ug via INTRAVENOUS
  Administered 2023-12-09 (×3): 12 ug via INTRAVENOUS
  Administered 2023-12-09: 8 ug via INTRAVENOUS

## 2023-12-09 MED ORDER — MIDAZOLAM HCL 2 MG/2ML IJ SOLN
INTRAMUSCULAR | Status: AC
  Administered 2023-12-09 (×2): 2 mg via INTRAVENOUS
  Filled 2023-12-09: qty 2

## 2023-12-09 MED ORDER — MIDAZOLAM HCL 2 MG/2ML IJ SOLN: 2.0000 mg | Freq: Once | INTRAMUSCULAR | Status: AC

## 2023-12-09 MED ORDER — ONDANSETRON HCL 4 MG/2ML IJ SOLN: 4.0000 mg | Freq: Four times a day (QID) | INTRAMUSCULAR | Status: DC | PRN

## 2023-12-09 MED ORDER — ORAL CARE MOUTH RINSE: 15.0000 mL | Freq: Once | OROMUCOSAL | Status: AC

## 2023-12-09 MED ORDER — DEXAMETHASONE SODIUM PHOSPHATE 10 MG/ML IJ SOLN
INTRAMUSCULAR | Status: AC
  Filled 2023-12-09: qty 3

## 2023-12-09 MED ORDER — METHOCARBAMOL 500 MG PO TABS
500.0000 mg | ORAL_TABLET | Freq: Four times a day (QID) | ORAL | Status: DC | PRN
  Administered 2023-12-09 (×2): 500 mg via ORAL
  Filled 2023-12-09: qty 1

## 2023-12-09 MED ORDER — METOCLOPRAMIDE HCL 5 MG/ML IJ SOLN: 5.0000 mg | Freq: Three times a day (TID) | INTRAMUSCULAR | Status: DC | PRN

## 2023-12-09 MED ORDER — 0.9 % SODIUM CHLORIDE (POUR BTL) OPTIME
TOPICAL | Status: DC | PRN
Start: 2023-12-09 — End: 2023-12-09
  Administered 2023-12-09 (×2): 2000 mL

## 2023-12-09 MED ORDER — PHENYLEPHRINE HCL (PRESSORS) 10 MG/ML IV SOLN
INTRAVENOUS | Status: AC
  Filled 2023-12-09: qty 1

## 2023-12-09 MED ORDER — PHENYLEPHRINE HCL (PRESSORS) 10 MG/ML IV SOLN
INTRAVENOUS | Status: AC
Start: 2023-12-09 — End: 2023-12-09
  Filled 2023-12-09: qty 1

## 2023-12-09 MED ORDER — SUGAMMADEX SODIUM 200 MG/2ML IV SOLN
INTRAVENOUS | Status: DC | PRN
  Administered 2023-12-09 (×2): 400 mg via INTRAVENOUS

## 2023-12-09 MED ORDER — SUCCINYLCHOLINE CHLORIDE 200 MG/10ML IV SOSY
PREFILLED_SYRINGE | INTRAVENOUS | Status: DC | PRN
  Administered 2023-12-09 (×2): 200 mg via INTRAVENOUS

## 2023-12-09 MED ORDER — HYDROMORPHONE HCL 1 MG/ML IJ SOLN
0.2500 mg | INTRAMUSCULAR | Status: DC | PRN
  Administered 2023-12-09 (×2): 0.5 mg via INTRAVENOUS

## 2023-12-09 MED ORDER — ENOXAPARIN SODIUM 40 MG/0.4ML IJ SOSY
40.0000 mg | PREFILLED_SYRINGE | INTRAMUSCULAR | Status: DC
  Administered 2023-12-10 (×2): 40 mg via SUBCUTANEOUS
  Filled 2023-12-09: qty 0.4

## 2023-12-09 SURGICAL SUPPLY — 84 items
BAG COUNTER SPONGE SURGICOUNT (BAG) ×2 IMPLANT
BANDAGE ESMARK 6X9 LF (GAUZE/BANDAGES/DRESSINGS) IMPLANT
BENZOIN TINCTURE PRP APPL 2/3 (GAUZE/BANDAGES/DRESSINGS) IMPLANT
BIT DRILL 2 LNG CALIBR (DRILL) IMPLANT
BIT DRILL 2.5 CANN STRL (BIT) IMPLANT
BIT DRILL 4 CANN (BIT) IMPLANT
BIT DRILL CANN LNG FLUTE 3.0 (BIT) IMPLANT
BIT DRILL SOLID LONG 5.5 (BIT) IMPLANT
BLADE SURG 15 STRL LF DISP TIS (BLADE) ×4 IMPLANT
BNDG COMPR ESMARK 4X3 LF (GAUZE/BANDAGES/DRESSINGS) IMPLANT
BNDG ELASTIC 4X5.8 VLCR STR LF (GAUZE/BANDAGES/DRESSINGS) IMPLANT
BNDG ELASTIC 6INX 5YD STR LF (GAUZE/BANDAGES/DRESSINGS) IMPLANT
BNDG ELASTIC 6X10 VLCR STRL LF (GAUZE/BANDAGES/DRESSINGS) ×2 IMPLANT
CHLORAPREP W/TINT 26 (MISCELLANEOUS) ×2 IMPLANT
CUFF TOURN SGL QUICK 42 (TOURNIQUET CUFF) IMPLANT
DRAIN CHANNEL 10F 3/8 F FF (DRAIN) IMPLANT
DRAPE C-ARM 42X120 X-RAY (DRAPES) IMPLANT
DRAPE C-ARMOR (DRAPES) ×2 IMPLANT
DRAPE IMP U-DRAPE 54X76 (DRAPES) ×2 IMPLANT
DRAPE U-SHAPE 47X51 STRL (DRAPES) ×2 IMPLANT
DRSG XEROFORM 1X8 (GAUZE/BANDAGES/DRESSINGS) ×2 IMPLANT
ELECTRODE REM PT RTRN 9FT ADLT (ELECTROSURGICAL) ×2 IMPLANT
EVACUATOR SILICONE 100CC (DRAIN) IMPLANT
GAUZE PAD ABD 8X10 STRL (GAUZE/BANDAGES/DRESSINGS) IMPLANT
GAUZE SPONGE 4X4 12PLY STRL (GAUZE/BANDAGES/DRESSINGS) ×2 IMPLANT
GAUZE SPONGE 4X4 12PLY STRL LF (GAUZE/BANDAGES/DRESSINGS) ×2 IMPLANT
GAUZE XEROFORM 1X8 LF (GAUZE/BANDAGES/DRESSINGS) ×2 IMPLANT
GAUZE XEROFORM 5X9 LF (GAUZE/BANDAGES/DRESSINGS) IMPLANT
GLOVE BIOGEL M STRL SZ7.5 (GLOVE) ×4 IMPLANT
GLOVE BIOGEL PI IND STRL 8 (GLOVE) ×4 IMPLANT
GOWN STRL REUS W/ TWL LRG LVL3 (GOWN DISPOSABLE) ×2 IMPLANT
GOWN STRL REUS W/ TWL XL LVL3 (GOWN DISPOSABLE) ×2 IMPLANT
GUIDEWIRE W/TROCAR TIP .094X8 (WIRE) IMPLANT
INSTRUMENT LRG BB TAK DISP (EXFIX) IMPLANT
KIT BASIN OR (CUSTOM PROCEDURE TRAY) ×2 IMPLANT
NDL 18GX1X1/2 (RX/OR ONLY) (NEEDLE) ×2 IMPLANT
NDL HYPO 22X1.5 SAFETY MO (MISCELLANEOUS) IMPLANT
NDL HYPO 25X1 1.5 SAFETY (NEEDLE) IMPLANT
NEEDLE 18GX1X1/2 (RX/OR ONLY) (NEEDLE) ×2 IMPLANT
NEEDLE HYPO 22X1.5 SAFETY MO (MISCELLANEOUS) ×2 IMPLANT
NEEDLE HYPO 25X1 1.5 SAFETY (NEEDLE) IMPLANT
NS IRRIG 1000ML POUR BTL (IV SOLUTION) ×2 IMPLANT
PACK ORTHO EXTREMITY (CUSTOM PROCEDURE TRAY) ×2 IMPLANT
PAD CAST 4YDX4 CTTN HI CHSV (CAST SUPPLIES) ×2 IMPLANT
PADDING CAST COTTON 6X4 STRL (CAST SUPPLIES) IMPLANT
PADDING CAST SYNTHETIC 4X4 STR (CAST SUPPLIES) IMPLANT
PLATE 3H FUSION TITANIUM TIB (Plate) IMPLANT
PLATE LOCK ANK 134 10H NS (Plate) IMPLANT
SCREW BONE LP TI 5.5X55 (Screw) IMPLANT
SCREW BONE TI LP 4.5X30 (Screw) IMPLANT
SCREW BONE TI LP 4.5X36 (Screw) IMPLANT
SCREW CANN 6.7X55 18 THD SD (Screw) IMPLANT
SCREW CORT 3.5X18 THRD (Screw) IMPLANT
SCREW CORT TI FT ANKLE 3.5X20 (Screw) IMPLANT
SCREW LO-PRO TI 3.5X16MM (Screw) IMPLANT
SCREW LOCK LOW PRO 4.5X38 (Screw) IMPLANT
SCREW LOW PRO TI 4.5X34 (Screw) IMPLANT
SCREW LOW PROFILE TI 4.5X38 (Screw) IMPLANT
SCREW LP TITANIUM 3.5X70 (Screw) IMPLANT
SCREW NL FT KREULOCK 3.5X50 (Screw) ×4 IMPLANT
SCREW NL FT KREULOCK 3.5X50 - LOG1266195 (Screw) ×2 IMPLANT
SHEET MEDIUM DRAPE 40X70 STRL (DRAPES) ×2 IMPLANT
SLEEVE SCD COMPRESS KNEE MED (STOCKING) ×2 IMPLANT
SPIKE FLUID TRANSFER (MISCELLANEOUS) ×2 IMPLANT
SPLINT PLASTER CAST FAST 5X30 (CAST SUPPLIES) IMPLANT
SPLINT PLASTER CAST XFAST 5X30 (CAST SUPPLIES) IMPLANT
SPONGE T-LAP 18X18 ~~LOC~~+RFID (SPONGE) ×2 IMPLANT
STOCKINETTE 6 STRL (DRAPES) ×2 IMPLANT
STOCKINETTE TUBULAR 6 INCH (GAUZE/BANDAGES/DRESSINGS) IMPLANT
STRIP CLOSURE SKIN 1/2X4 (GAUZE/BANDAGES/DRESSINGS) ×2 IMPLANT
SUCTION TUBE FRAZIER 10FR DISP (SUCTIONS) IMPLANT
SUT ETHILON 3 0 PS 1 (SUTURE) ×2 IMPLANT
SUT MNCRL AB 3-0 PS2 18 (SUTURE) ×2 IMPLANT
SUT MNCRL AB 3-0 PS2 27 (SUTURE) IMPLANT
SUT PDS AB 2-0 CT2 27 (SUTURE) ×2 IMPLANT
SUT SILK 2-0 18XBRD TIE 12 (SUTURE) IMPLANT
SUT VIC AB 2-0 CT1 TAPERPNT 27 (SUTURE) IMPLANT
SUT VIC AB 3-0 FS2 27 (SUTURE) ×2 IMPLANT
SYR 20ML LL LF (SYRINGE) ×2 IMPLANT
SYR BULB EAR ULCER 3OZ GRN STR (SYRINGE) ×2 IMPLANT
SYR CONTROL 10ML LL (SYRINGE) IMPLANT
TOWEL GREEN STERILE FF (TOWEL DISPOSABLE) ×4 IMPLANT
TUBE CONNECTING 20X1/4 (TUBING) ×2 IMPLANT
UNDERPAD 30X36 HEAVY ABSORB (UNDERPADS AND DIAPERS) ×2 IMPLANT

## 2023-12-09 NOTE — Anesthesia Postprocedure Evaluation (Signed)
 Anesthesia Post Note  Patient: Deen Deguia  Procedure(s) Performed: REMOVAL, HARDWARE (Right) ARTHRODESIS ANKLE (Right: Ankle) LENGTHENING, TENDON, ACHILLES (Right)     Patient location during evaluation: PACU Anesthesia Type: General Level of consciousness: awake Pain management: pain level controlled Vital Signs Assessment: post-procedure vital signs reviewed and stable Respiratory status: spontaneous breathing, nonlabored ventilation and respiratory function stable Cardiovascular status: blood pressure returned to baseline and stable Postop Assessment: no apparent nausea or vomiting Anesthetic complications: no   No notable events documented.  Last Vitals:  Vitals:   12/09/23 1530 12/09/23 1535  BP: 121/78 119/84  Pulse: 93 90  Resp: (!) 22 (!) 22  Temp:  36.9 C  SpO2: 93% 94%    Last Pain:  Vitals:   12/09/23 1530  TempSrc:   PainSc: Asleep                 Delon Aisha Arch

## 2023-12-09 NOTE — H&P (Signed)
 PREOPERATIVE H&P  Chief Complaint: Right ankle pain  HPI: Chris Carlson is a 38 y.o. 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  He has failed hardware as well.  He has had pain for several months that has not improved with conservative treatment and is here today for definitive surgery. Symptoms are rated as moderate to severe, and have been worsening.  This is significantly impairing activities of daily living.  He has elected for surgical management.   Past Medical History:  Diagnosis Date   Arthritis    Right Ankle   Dysrhythmia    occasional palpitations   Hyperlipidemia    Hypertension    Obesity    Past Surgical History:  Procedure Laterality Date   ANKLE FRACTURE SURGERY Right 2012   Social History   Socioeconomic History   Marital status: Married    Spouse name: Not on file   Number of children: Not on file   Years of education: Not on file   Highest education level: Not on file  Occupational History   Not on file  Tobacco Use   Smoking status: Never   Smokeless tobacco: Never  Vaping Use   Vaping status: Never Used  Substance and Sexual Activity   Alcohol use: Never   Drug use: Never   Sexual activity: Yes  Other Topics Concern   Not on file  Social History Narrative   Lives with wife and father in [place], works for [employer].  Married.   Exercise - some walking.  10/2021   Social Drivers of Health   Financial Resource Strain: Not on file  Food Insecurity: Not on file  Transportation Needs: Not on file  Physical Activity: Insufficiently Active (11/20/2022)   Exercise Vital Sign    Days of Exercise per Week: 3 days    Minutes of Exercise per Session: 10 min  Stress: Not on file  Social Connections: Not on file   Family History  Problem Relation Age of Onset   Anemia Mother    Hypertension Maternal Grandmother     Diabetes Maternal Grandmother    Cancer Maternal Grandmother        breast   Cancer Maternal Grandfather        prostate   Diabetes Paternal Grandmother    Hypertension Paternal Grandmother    Cancer Paternal Grandfather        prostate   Heart disease Paternal Grandfather        MI   Stroke Neg Hx    No Known Allergies Prior to Admission medications   Medication Sig Start Date End Date Taking? Authorizing Provider  acetaminophen  (TYLENOL ) 500 MG tablet Take 1,000 mg by mouth every 6 (six) hours as needed for moderate pain (pain score 4-6).   Yes [provider]  ibuprofen (ADVIL) 200 MG tablet Take 400 mg by mouth every 6 (six) hours as needed for moderate pain (pain score 4-6).   Yes [provider]  atorvastatin  (LIPITOR) 40 MG tablet Take 1 tablet (40 mg total) by mouth daily. Patient not taking: Reported on 11/27/2023 05/05/23 08/03/23  Tysinger, Alm RAMAN, PA-C  omeprazole  (PRILOSEC) 40 MG capsule Take 1 capsule (40 mg total) by mouth daily. Patient not taking: Reported on 11/27/2023 05/05/23   Tysinger, Alm RAMAN, PA-C  ondansetron  (ZOFRAN -ODT) 4 MG disintegrating tablet Take 1 tablet (4 mg total) by mouth every 8 (eight)  hours as needed for nausea or vomiting. Patient not taking: Reported on 11/27/2023 05/05/23   Tysinger, Alm RAMAN, PA-C  oxyCODONE  (ROXICODONE ) 5 MG immediate release tablet Take 1 tablet (5 mg total) by mouth every 4 (four) hours as needed for severe pain (pain score 7-10). Patient not taking: Reported on 11/27/2023 10/24/23   Hildegard Loge, PA-C  Plecanatide  (TRULANCE ) 3 MG TABS Take 1 tablet (3 mg total) by mouth daily. Patient not taking: Reported on 11/27/2023 05/05/23   Tysinger, Alm RAMAN, PA-C  Semaglutide -Weight Management (WEGOVY ) 2.4 MG/0.75ML SOAJ Inject 2.4 mg into the skin once a week. Patient not taking: Reported on 11/27/2023 05/05/23   Tysinger, Alm RAMAN, PA-C  valsartan -hydrochlorothiazide  (DIOVAN -HCT) 80-12.5 MG tablet Take 1 tablet by mouth  daily. Patient not taking: Reported on 11/27/2023 05/05/23   Tysinger, Alm RAMAN, PA-C     Positive ROS: All other systems have been reviewed and were otherwise negative with the exception of those mentioned in the HPI and as above.  Physical Exam:  Vitals:   12/09/23 1032  BP: 127/82  Pulse: 88  Resp: 19  Temp: 98.1 F (36.7 C)  SpO2: 98%   General: Alert, no acute distress Cardiovascular: No pedal edema Respiratory: No cyanosis, no use of accessory musculature GI: No organomegaly, abdomen is soft and non-tender Skin: No lesions in the area of chief complaint Neurologic: Sensation intact distally Psychiatric: Patient is competent for consent with normal mood and affect Lymphatic: No axillary or cervical lymphadenopathy  MUSCULOSKELETAL: Right ankle demonstrates swelling.  Slight deformity.  Tender to palpation laterally, medially and anteriorly about the ankle joint.  Stiffness noted.  Strength is good in all planes with intact sensation.  Assessment: Right end-stage posttraumatic ankle arthritis in the setting of failed orthopedic hardware, fibular nonunion and ankle deformity.   Plan: Plan for surgery to address all his issues including open treatment of his right sided fibula with repair of nonunion, distal tibiofibular joint arthrodesis as well as tibiotalar joint arthrodesis and hardware removals.  He may require tendo Achilles lengthening but we will see.  We discussed the risks, benefits and alternatives of surgery which include but are not limited to wound healing complications, infection, nonunion, malunion, need for further surgery, damage to surrounding structures and continued pain.  They understand there is no guarantees to an acceptable outcome.  After weighing these risks they opted to proceed with surgery.     [name], MD    12/09/2023 10:38 AM

## 2023-12-09 NOTE — Anesthesia Procedure Notes (Signed)
 Procedure Name: Intubation Date/Time: 12/09/2023 11:36 AM  Performed by: Claudene Florina Boga, CRNAPre-anesthesia Checklist: Patient identified, Emergency Drugs available, Suction available and Patient being monitored Patient Re-evaluated:Patient Re-evaluated prior to induction Oxygen Delivery Method: Circle System Utilized Preoxygenation: Pre-oxygenation with 100% oxygen Induction Type: IV induction and Rapid sequence Laryngoscope Size: Glidescope and 4 Grade View: Grade I Tube type: Oral Tube size: 7.5 mm Number of attempts: 1 Airway Equipment and Method: Rigid stylet and Video-laryngoscopy Placement Confirmation: ETT inserted through vocal cords under direct vision, positive ETCO2 and breath sounds checked- equal and bilateral Secured at: 24 cm Tube secured with: Tape Dental Injury: Teeth and Oropharynx as per pre-operative assessment  Comments: Elective glidescope intubation d/t body habitus.

## 2023-12-09 NOTE — Transfer of Care (Signed)
 Immediate Anesthesia Transfer of Care Note  Patient: Chris Carlson  Procedure(s) Performed: REMOVAL, HARDWARE (Right) ARTHRODESIS ANKLE (Right: Ankle) LENGTHENING, TENDON, ACHILLES (Right)  Patient Location: PACU  Anesthesia Type:General  Level of Consciousness: awake, alert , and oriented  Airway & Oxygen Therapy: Patient Spontanous Breathing and Patient connected to face mask oxygen  Post-op Assessment: Report given to RN and Post -op Vital signs reviewed and stable  Post vital signs: Reviewed and stable  Last Vitals:  Vitals Value Taken Time  BP 124/77 12/09/23 15:06  Temp    Pulse 94 12/09/23 15:09  Resp 13 12/09/23 15:09  SpO2 97 % 12/09/23 15:09  Vitals shown include unfiled device data.  Last Pain:  Vitals:   12/09/23 1106  TempSrc:   PainSc: 4       Patients Stated Pain Goal: 2 (12/09/23 1038)  Complications: No notable events documented.

## 2023-12-09 NOTE — Anesthesia Preprocedure Evaluation (Addendum)
 Anesthesia Evaluation  Patient identified by MRN, date of birth, ID band Patient awake    Reviewed: Allergy & Precautions, NPO status , Patient's Chart, lab work & pertinent test results  Airway Mallampati: III  TM Distance: >3 FB Neck ROM: Full    Dental  (+) Dental Advisory Given   Pulmonary neg shortness of breath, sleep apnea , neg COPD, neg recent URI   Pulmonary exam normal breath sounds clear to auscultation       Cardiovascular hypertension (valsartan -HCTZ), Pt. on medications (-) angina (-) Past MI and (-) Cardiac Stents + dysrhythmias (palpitations)  Rhythm:Regular Rate:Normal  HLD   Neuro/Psych negative neurological ROS     GI/Hepatic Neg liver ROS,GERD  Medicated,,  Endo/Other  neg diabetes  Class 4 obesity  Renal/GU negative Renal ROS     Musculoskeletal  (+) Arthritis ,    Abdominal  (+) + obese  Peds  Hematology negative hematology ROS (+) Lab Results      Component                Value               Date                      WBC                      6.1                 12/01/2023                HGB                      12.4 (L)            12/01/2023                HCT                      39.9                12/01/2023                MCV                      75.0 (L)            12/01/2023                PLT                      253                 12/01/2023              Anesthesia Other Findings Not taking Wegovy   Reproductive/Obstetrics                              Anesthesia Physical Anesthesia Plan  ASA: 3  Anesthesia Plan: General   Post-op Pain Management: Regional block*   Induction: Intravenous  PONV Risk Score and Plan: 2 and Ondansetron , Dexamethasone  and Treatment may vary due to age or medical condition  Airway Management Planned: Oral ETT and Video Laryngoscope Planned  Additional Equipment:   Intra-op Plan:   Post-operative Plan: Extubation in  OR  Informed Consent: I have reviewed the patients History and  Physical, chart, labs and discussed the procedure including the risks, benefits and alternatives for the proposed anesthesia with the patient or authorized representative who has indicated his/her understanding and acceptance.     Dental advisory given  Plan Discussed with: CRNA and Anesthesiologist  Anesthesia Plan Comments: (Discussed potential risks of nerve blocks including, but not limited to, infection, bleeding, nerve damage, seizures, pneumothorax, respiratory depression, and potential failure of the block. Alternatives to nerve blocks discussed. All questions answered.  Risks of general anesthesia discussed including, but not limited to, sore throat, hoarse voice, chipped/damaged teeth, injury to vocal cords, nausea and vomiting, allergic reactions, lung infection, heart attack, stroke, and death. All questions answered. )         Anesthesia Quick Evaluation

## 2023-12-09 NOTE — Brief Op Note (Signed)
 12/09/2023  2:37 PM  PATIENT:  Chris Carlson  38 y.o. male  PRE-OPERATIVE DIAGNOSIS:  PAINFUL DEEP ORTHOPEDIC HARDWARE RIGHT TIBIA AND FIBULA, HARDWARE FAILURE, FIBULA NONUNION, ANKLE OSTEOARTHRITIS, EQUINUS CONTRCTURE  POST-OPERATIVE DIAGNOSIS:  PAINFUL DEEP ORTHOPEDIC HARDWARE RIGHT TIBIA AND FIBULA, HARDWARE FAILURE, FIBULA NONUNION, ANKLE OSTEOARTHRITIS, EQUINUS CONTRCTURE  PROCEDURE:   Deep orthopedic hardware removal, right lateral ankle Repair of fibular nonunion Deep orthopedic hardware removal, medial ankle through separate incision Right tibiotalar joint arthrodesis Right tibiofibular joint arthrodesis, distal  SURGEON:  Surgeons and Role:    DEWAINE Elsa Lonni JONELLE, MD - Primary  PHYSICIAN ASSISTANT: Jesse [place], PA-C  ASSISTANTS: none   ANESTHESIA:   general with peripheral nerve blockade, popliteal nerve block, infiltration of local anesthetic to the medial ankle  EBL:  300 mL   BLOOD ADMINISTERED:none  DRAINS: JP drain  LOCAL MEDICATIONS USED:  MARCAINE      SPECIMEN:  No Specimen  DISPOSITION OF SPECIMEN:  N/A  COUNTS:  YES  TOURNIQUET:   Total Tourniquet Time Documented: Thigh (Right) - 121 minutes Total: Thigh (Right) - 121 minutes   DICTATION: .[place] Dictation  PLAN OF CARE: Admit for overnight observation  PATIENT DISPOSITION:  PACU - hemodynamically stable.   Delay start of Pharmacological VTE agent (>24hrs) due to surgical blood loss or risk of bleeding: yes

## 2023-12-10 ENCOUNTER — Other Ambulatory Visit (HOSPITAL_COMMUNITY): Payer: Self-pay

## 2023-12-10 DIAGNOSIS — I10 Essential (primary) hypertension: Secondary | ICD-10-CM | POA: Diagnosis not present

## 2023-12-10 DIAGNOSIS — Z79899 Other long term (current) drug therapy: Secondary | ICD-10-CM | POA: Diagnosis not present

## 2023-12-10 DIAGNOSIS — T84116A Breakdown (mechanical) of internal fixation device of bone of right lower leg, initial encounter: Secondary | ICD-10-CM | POA: Diagnosis not present

## 2023-12-10 DIAGNOSIS — Z7982 Long term (current) use of aspirin: Secondary | ICD-10-CM | POA: Diagnosis not present

## 2023-12-10 DIAGNOSIS — S82891K Other fracture of right lower leg, subsequent encounter for closed fracture with nonunion: Secondary | ICD-10-CM | POA: Diagnosis not present

## 2023-12-10 DIAGNOSIS — M25571 Pain in right ankle and joints of right foot: Secondary | ICD-10-CM | POA: Diagnosis not present

## 2023-12-10 DIAGNOSIS — T84196A Other mechanical complication of internal fixation device of bone of right lower leg, initial encounter: Secondary | ICD-10-CM | POA: Diagnosis not present

## 2023-12-10 DIAGNOSIS — M19071 Primary osteoarthritis, right ankle and foot: Secondary | ICD-10-CM | POA: Diagnosis not present

## 2023-12-10 DIAGNOSIS — M19171 Post-traumatic osteoarthritis, right ankle and foot: Secondary | ICD-10-CM | POA: Diagnosis not present

## 2023-12-10 DIAGNOSIS — Y792 Prosthetic and other implants, materials and accessory orthopedic devices associated with adverse incidents: Secondary | ICD-10-CM | POA: Diagnosis not present

## 2023-12-10 MED ORDER — ASPIRIN 325 MG PO TABS
ORAL_TABLET | ORAL | 0 refills | Status: AC
  Filled 2023-12-10: qty 30, 30d supply, fill #0

## 2023-12-10 MED ORDER — OXYCODONE HCL 5 MG PO TABS
5.0000 mg | ORAL_TABLET | ORAL | 0 refills | Status: AC | PRN
  Filled 2023-12-10: qty 30, 5d supply, fill #0

## 2023-12-10 NOTE — TOC CM/SW Note (Addendum)
 Transition of Care [facility]) - Inpatient Brief Assessment   Patient Details  Name: Chris Carlson MRN: [id number] Date of Birth: [birth year]  Transition of Care [facility]) CM/SW Contact:    Lauraine FORBES Saa, LCSWA Phone Number: 12/10/2023, 9:03 AM   Clinical Narrative:  9:03 AM Per chart review, patient resides at home with spouse and relatives. Patient has a PCP and insurance. Patient does not have SNF or HH history. Patient has auto CPAP through [employer]. Patient's preferred pharmacy is [facility] [phone] [facility]. TOC consult was placed for HH/DME needs and other (PT/OT/SLP/DME as needed). TOC will continue to follow and be available to assist.  Transition of Care Asessment: Insurance and Status: Insurance coverage has been reviewed Patient has primary care physician: Yes Home environment has been reviewed: Private Residence Prior level of function:: N/A Prior/Current Home Services: No current home services Social Drivers of Health Review: SDOH reviewed no interventions necessary Readmission risk has been reviewed: Yes (Currently Observation Status) Transition of care needs: transition of care needs identified, TOC will continue to follow

## 2023-12-10 NOTE — Progress Notes (Signed)
 PIV removed, AVS printed and gone over with pt and wife at bedside, verbalized no further questions at this time, Pt aware of meds down in TOC. Transport pending

## 2023-12-10 NOTE — Discharge Summary (Signed)
 Patient ID: Chris Carlson MRN: [id number] DOB/AGE: 03-Nov-1985 42 y.o.  Admit date: 12/09/2023 Discharge date: 12/10/2023  Admission Diagnoses:  Principal Problem:   Osteoarthritis of right ankle   Discharge Diagnoses:  Same  Past Medical History:  Diagnosis Date   Arthritis    Right Ankle   Dysrhythmia    occasional palpitations   Hyperlipidemia    Hypertension    Obesity     Surgeries: Procedure(s): REMOVAL, HARDWARE ARTHRODESIS ANKLE LENGTHENING, TENDON, ACHILLES on 12/09/2023   Consultants:   Discharged Condition: Improved  Hospital Course: Chris Carlson is an 38 y.o. male who was admitted 12/09/2023 for operative treatment ofOsteoarthritis of right ankle. Patient has severe unremitting pain that affects sleep, daily activities, and work/hobbies. After pre-op clearance the patient was taken to the operating room on 12/09/2023 and underwent  Procedure(s): REMOVAL, HARDWARE ARTHRODESIS ANKLE LENGTHENING, TENDON, ACHILLES.    Patient was given perioperative antibiotics:  Anti-infectives (From admission, onward)    Start     Dose/Rate Route Frequency Ordered Stop   12/09/23 1800  ceFAZolin  (ANCEF ) IVPB 2g/100 mL premix        2 g 200 mL/hr over 30 Minutes Intravenous Every 6 hours 12/09/23 1557 12/10/23 0007   12/09/23 0945  ceFAZolin  (ANCEF ) IVPB 3g/150 mL premix        3 g 300 mL/hr over 30 Minutes Intravenous On call to O.R. 12/09/23 0934 12/09/23 1208        Patient was given sequential compression devices, early ambulation, and chemoprophylaxis to prevent DVT.  Inpatient Morphine Milligram Equivalents Per Day 8/12 - 8/13   Values displayed are in units of MME/Day    Order Start / End Date Yesterday Today    oxyCODONE  (Oxy IR/ROXICODONE ) immediate release tablet 5 mg 8/12 - 8/12 0 of Unknown --    oxyCODONE  (ROXICODONE ) 5 MG/5ML solution 5 mg 8/12 - 8/12 0 of Unknown --      Group total: 0 of Unknown     fentaNYL  (SUBLIMAZE ) 100 MCG/2ML injection 8/12 -  8/12 0 of Unknown --    fentaNYL  (SUBLIMAZE ) injection 100 mcg 8/12 - 8/12 30 of 30 --    fentaNYL  citrate (PF) (SUBLIMAZE ) injection 8/12 - 8/12 *75 of 75 --    HYDROmorphone  (DILAUDID ) injection 0.25-0.5 mg 8/12 - 8/12 10 of 40-80 --    meperidine  (DEMEROL ) injection 6.25-12.5 mg 8/12 - 8/12 0 of 7.5-15 --    HYDROmorphone  (DILAUDID ) injection 0.5-1 mg 8/12 - No end date 40 of 30-60 0 of 60-120    oxyCODONE  (Oxy IR/ROXICODONE ) immediate release tablet 5 mg 8/12 - No end date 0 of 22.5 7.5 of 45    oxyCODONE  (Oxy IR/ROXICODONE ) immediate release tablet 10 mg 8/12 - No end date 15 of 45 0 of 90    Daily Totals  * 170 of Unknown (at least 250-327.5) 7.5 of 195-255  *One-Step medication  Calculation Errors     Order Type Date Details   fentaNYL  (SUBLIMAZE ) 100 MCG/2ML injection Ordered Dose -- Frequency type could not be determined   oxyCODONE  (Oxy IR/ROXICODONE ) immediate release tablet 5 mg Ordered Dose -- Insufficient frequency information   oxyCODONE  (ROXICODONE ) 5 MG/5ML solution 5 mg Ordered Dose -- Insufficient frequency information            Patient benefited maximally from hospital stay and there were no complications.    Recent vital signs: Patient Vitals for the past 24 hrs:  BP Temp Temp src Pulse Resp SpO2  12/10/23 1209 121/76  98.8 F (37.1 C) Oral (!) 109 18 96 %  12/10/23 0756 118/68 98.6 F (37 C) Oral (!) 101 19 96 %  12/10/23 0409 100/62 98.2 F (36.8 C) Oral 96 19 96 %  12/09/23 2344 118/67 98.2 F (36.8 C) Oral (!) 102 19 97 %  12/09/23 2019 105/66 98.9 F (37.2 C) Oral 99 20 97 %  12/09/23 1558 118/72 98.3 F (36.8 C) Oral 81 -- 94 %  12/09/23 1535 119/84 98.5 F (36.9 C) -- 90 (!) 22 94 %  12/09/23 1530 121/78 -- -- 93 (!) 22 93 %  12/09/23 1522 -- -- -- 92 (!) 24 97 %  12/09/23 1515 121/81 -- -- 95 (!) 26 98 %  12/09/23 1505 124/77 97.8 F (36.6 C) -- -- (!) 24 98 %     Recent laboratory studies:  Recent Labs    12/09/23 1831  WBC 10.0  HGB  10.9*  HCT 34.3*  PLT 224  CREATININE 1.18     Discharge Medications:   Allergies as of 12/10/2023   No Known Allergies      Medication List     TAKE these medications    acetaminophen  500 MG tablet Commonly known as: TYLENOL  Take 1,000 mg by mouth every 6 (six) hours as needed for moderate pain (pain score 4-6).   aspirin  325 MG tablet Commonly known as: Bayer Aspirin  Take 1 tablet by mouth for 30 DAYS for blood clot prevention   atorvastatin  40 MG tablet Commonly known as: LIPITOR Take 1 tablet (40 mg total) by mouth daily.   ibuprofen 200 MG tablet Commonly known as: ADVIL Take 400 mg by mouth every 6 (six) hours as needed for moderate pain (pain score 4-6).   omeprazole  40 MG capsule Commonly known as: PRILOSEC Take 1 capsule (40 mg total) by mouth daily.   ondansetron  4 MG disintegrating tablet Commonly known as: ZOFRAN -ODT Take 1 tablet (4 mg total) by mouth every 8 (eight) hours as needed for nausea or vomiting.   oxyCODONE  5 MG immediate release tablet Commonly known as: Roxicodone  Take 1 tablet (5 mg total) by mouth every 4 (four) hours as needed for severe pain (pain score 7-10). What changed: Another medication with the same name was added. Make sure you understand how and when to take each.   oxyCODONE  5 MG immediate release tablet Commonly known as: Roxicodone  Take 1 tablet (5 mg total) by mouth every 4 (four) hours as needed. What changed: You were already taking a medication with the same name, and this prescription was added. Make sure you understand how and when to take each.   Trulance  3 MG Tabs Generic drug: Plecanatide  Take 1 tablet (3 mg total) by mouth daily.   valsartan -hydrochlorothiazide  80-12.5 MG tablet Commonly known as: DIOVAN -HCT Take 1 tablet by mouth daily.   Wegovy  2.4 MG/0.75ML Soaj SQ injection Generic drug: semaglutide -weight management Inject 2.4 mg into the skin once a week.               Discharge Care  Instructions  (From admission, onward)           Start     Ordered   12/10/23 0000  Non weight bearing       Comments: In splint  Question Answer Comment  Laterality right   Extremity Lower      12/10/23 1314            Diagnostic Studies: DG Ankle Complete Right Result Date: 12/09/2023 CLINICAL DATA:  Elective surgery.  Repair of nonunion of fibula. EXAM: RIGHT ANKLE - COMPLETE 3+ VIEW COMPARISON:  Radiograph 10/24/2023 FINDINGS: Three fluoroscopic spot views of the ankle submitted from the operating room. There is a new lateral plate and screw fixation of the distal fibula, including syndesmotic screws. New plate and screw fixation of the anterior tibia. Previous medial tibial hardware is again seen. Fluoroscopy time 50 seconds. Dose 3.49 mGy. IMPRESSION: Intraoperative fluoroscopy during right ankle surgery. Electronically Signed   By: Andrea Gasman M.D.   On: 12/09/2023 15:09   DG C-Arm 1-60 Min-No Report Result Date: 12/09/2023 Fluoroscopy was utilized by the requesting physician.  No radiographic interpretation.   DG C-Arm 1-60 Min-No Report Result Date: 12/09/2023 Fluoroscopy was utilized by the requesting physician.  No radiographic interpretation.   DG C-Arm 1-60 Min-No Report Result Date: 12/09/2023 Fluoroscopy was utilized by the requesting physician.  No radiographic interpretation.    Disposition: Discharge disposition: 01-Home or Self Care       Discharge Instructions     Call MD / Call 911   Complete by: As directed    If you experience chest pain or shortness of breath, CALL 911 and be transported to the hospital emergency room.  If you develope a fever above 101 F, pus (white drainage) or increased drainage or redness at the wound, or calf pain, call your surgeon's office.   Constipation Prevention   Complete by: As directed    Drink plenty of fluids.  Prune juice may be helpful.  You may use a stool softener, such as Colace (over the counter) 100  mg twice a day.  Use MiraLax (over the counter) for constipation as needed.   Diet - low sodium heart healthy   Complete by: As directed    Non weight bearing   Complete by: As directed    In splint   Laterality: right   Extremity: Lower   Post-operative opioid taper instructions:   Complete by: As directed    POST-OPERATIVE OPIOID TAPER INSTRUCTIONS: It is important to wean off of your opioid medication as soon as possible. If you do not need pain medication after your surgery it is ok to stop day one. Opioids include: Codeine, Hydrocodone(Norco, Vicodin), Oxycodone (Percocet, oxycontin ) and hydromorphone  amongst others.  Long term and even short term use of opiods can cause: Increased pain response Dependence Constipation Depression Respiratory depression And more.  Withdrawal symptoms can include Flu like symptoms Nausea, vomiting And more Techniques to manage these symptoms Hydrate well Eat regular healthy meals Stay active Use relaxation techniques(deep breathing, meditating, yoga) Do Not substitute Alcohol to help with tapering If you have been on opioids for less than two weeks and do not have pain than it is ok to stop all together.  Plan to wean off of opioids This plan should start within one week post op of your joint replacement. Maintain the same interval or time between taking each dose and first decrease the dose.  Cut the total daily intake of opioids by one tablet each day Next start to increase the time between doses. The last dose that should be eliminated is the evening dose.           Follow-up Information     Elsa Lonni SAUNDERS, MD Follow up in 2 day(s).   Specialty: Orthopedic Surgery Contact information: [address] [place] [address] [phone]                  Signed: Josefa PARAS  [place] 12/10/2023, 1:15 PM

## 2023-12-10 NOTE — Evaluation (Signed)
 Occupational Therapy Evaluation Patient Details Name: Chris Carlson MRN: [id number] DOB: [birth year] Today's Date: 12/10/2023   History of Present Illness   38 y.o. 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 S/p hardware removal 8/12. PMH arthritis, dysrhythmia, hyperlipidemia, HTN, obesity     Clinical Impressions Pt c/o 5/10 pain to R ankle. Pt lives at home with wife and step dad who are available 24/7 if needed, 1+1 STE, PLOF uses cane, needs mod A for LB dressing at baseline. Pt currently requires use of RW for ambulation, NWB to RLE, supervision for short distances with RW. Pt will need set up assistance for ADLs, mod A for LB ADLs, plans to sleep on sectional and sponge bathe at sink downstairs until able to complete stairs to get to walk in shower upstairs. Pt at this time is close to baseline with ADLs, has support at home, would benefit from Harris RW for home to maximize safety with mobility. Pt has no further acute OT needs, no OT follow up needed, follow physicians recommendations for follow up therapies. Signing off.      If plan is discharge home, recommend the following:   A little help with walking and/or transfers;A little help with bathing/dressing/bathroom;Assistance with cooking/housework;Assist for transportation;Help with stairs or ramp for entrance     Functional Status Assessment   Patient has had a recent decline in their functional status and demonstrates the ability to make significant improvements in function in a reasonable and predictable amount of time.     Equipment Recommendations   Other (comment) (bari RW)     Recommendations for Other Services         Precautions/Restrictions   Precautions Precautions: Fall Recall of Precautions/Restrictions: Intact Precaution/Restrictions Comments: R ankle JP  drain Restrictions Weight Bearing Restrictions Per Provider Order: Yes RLE Weight Bearing Per Provider Order: Non weight bearing     Mobility Bed Mobility Overal bed mobility: Needs Assistance             General bed mobility comments: sitting EOB.    Transfers Overall transfer level: Needs assistance Equipment used: Rolling walker (2 wheels), Crutches Transfers: Sit to/from Stand Sit to Stand: Supervision, Contact guard assist           General transfer comment: Supervision with RW, CGA for crutches. Verbal cues for hand and foot placement      Balance Overall balance assessment: Needs assistance Sitting-balance support: Feet supported Sitting balance-Leahy Scale: Good     Standing balance support: Bilateral upper extremity supported Standing balance-Leahy Scale: Poor Standing balance comment: reliant on AD                           ADL either performed or assessed with clinical judgement   ADL Overall ADL's : Needs assistance/impaired Eating/Feeding: Independent   Grooming: Set up;Sitting   Upper Body Bathing: Set up;Sitting   Lower Body Bathing: Moderate assistance;Sitting/lateral leans   Upper Body Dressing : Set up;Sitting   Lower Body Dressing: Moderate assistance;Sitting/lateral leans;Sit to/from stand   Toilet Transfer: Set up;Supervision/safety;Rolling walker (2 wheels)   Toileting- Clothing Manipulation and Hygiene: Set up;Supervision/safety       Functional mobility during ADLs: Supervision/safety;Rolling walker (2 wheels) General ADL Comments: Pt mod A for LB ADLs at baseline, set up/sitting for UB ADLs. Pt able to stand/transfer with RW at supervision level,  NWB to RLE.     Vision Baseline Vision/History: 0 No visual deficits Ability to See in Adequate Light: 0 Adequate Patient Visual Report: No change from baseline       Perception         Praxis         Pertinent Vitals/Pain Pain Assessment Pain Assessment:  0-10 Pain Score: 5  Pain Location: R ankle Pain Descriptors / Indicators: Aching, Constant Pain Intervention(s): Monitored during session     Extremity/Trunk Assessment Upper Extremity Assessment Upper Extremity Assessment: Overall WFL for tasks assessed   Lower Extremity Assessment Lower Extremity Assessment: RLE deficits/detail RLE Deficits / Details: Cast applied. Hip/knee WFL   Cervical / Trunk Assessment Cervical / Trunk Assessment: Other exceptions Cervical / Trunk Exceptions: increased body habitus   Communication Communication Communication: No apparent difficulties   Cognition Arousal: Alert Behavior During Therapy: WFL for tasks assessed/performed Cognition: No apparent impairments                               Following commands: Intact       Cueing  General Comments   Cueing Techniques: Verbal cues      Exercises     Shoulder Instructions      Home Living Family/patient expects to be discharged to:: Private residence Living Arrangements: Spouse/significant other;Other relatives Available Help at Discharge: Family;Available 24 hours/day Type of Home: House Home Access: Stairs to enter Entrance Stairs-Number of Steps: 1+1   Home Layout: Two level;1/2 bath on main level;Other (Comment) (walk in shower upstairs) Alternate Level Stairs-Number of Steps: flight   Bathroom Shower/Tub: Walk-in shower         Home Equipment: Shower seat;Hand held shower head;Grab bars - tub/shower;Other (comment);Crutches (knee scooter)   Additional Comments: Pt lives with wife and her father, someone is available 24/7 to assist with ADLs/IADLs, wife can assist with transfers.      Prior Functioning/Environment Prior Level of Function : Needs assist             Mobility Comments: used cane ADLs Comments: help with socks/shoes,    OT Problem List: Decreased strength;Decreased range of motion;Decreased activity tolerance;Impaired balance (sitting  and/or standing);Pain;Obesity   OT Treatment/Interventions:        OT Goals(Current goals can be found in the care plan section)   Acute Rehab OT Goals Patient Stated Goal: to manage pain OT Goal Formulation: With patient/family Time For Goal Achievement: 12/24/23 Potential to Achieve Goals: Good   OT Frequency:       Co-evaluation              AM-PAC OT 6 Clicks Daily Activity     Outcome Measure Help from another person eating meals?: None Help from another person taking care of personal grooming?: A Little Help from another person toileting, which includes using toliet, bedpan, or urinal?: A Little Help from another person bathing (including washing, rinsing, drying)?: A Lot Help from another person to put on and taking off regular upper body clothing?: A Little Help from another person to put on and taking off regular lower body clothing?: A Lot 6 Click Score: 17   End of Session Equipment Utilized During Treatment: Gait belt;Rolling walker (2 wheels) Nurse Communication: Mobility status  Activity Tolerance: Patient tolerated treatment well Patient left: in bed;with call bell/phone within reach;with family/visitor present  OT Visit Diagnosis: Unsteadiness on feet (R26.81);Other abnormalities of gait and mobility (R26.89);Muscle weakness (generalized) (  M62.81);Pain Pain - Right/Left: Right Pain - part of body: Ankle and joints of foot                Time: 8876-8862 OT Time Calculation (min): 14 min Charges:  OT General Charges $OT Visit: 1 Visit OT Evaluation $OT Eval Low Complexity: 1 Low  [address] OTR/L   Chris Carlson 12/10/2023, 12:22 PM

## 2023-12-10 NOTE — Discharge Instructions (Signed)
 DR[facility] POST-OP INSTRUCTIONS   Pain Management The numbing medicine and your leg will last around 18 hours, take a dose of your pain medicine as soon as you feel it wearing off to avoid rebound pain. Keep your foot elevated above heart level.  Make sure that your heel hangs free ('floats'). Take all prescribed medication as directed. If taking narcotic pain medication you may want to use an over-the-counter stool softener to avoid constipation. You may take over-the-counter NSAIDs (ibuprofen, naproxen, etc.) as well as over-the-counter acetaminophen  as directed on the packaging as a supplement for your pain and may also use it to wean away from the prescription medication.  Activity Non-weightbearing Keep splint intact  First Postoperative Visit Your first postop visit will be at least 2 weeks after surgery.  This should be scheduled when you schedule surgery. If you do not have a postoperative visit scheduled please call [phone] to schedule an appointment. At the appointment your incision will be evaluated for suture removal, x-rays will be obtained if necessary.  General Instructions Swelling is very common after foot and ankle surgery.  It often takes 3 months for the foot and ankle to begin to feel comfortable.  Some amount of swelling will persist for 6-12 months. DO NOT change the dressing.  If there is a problem with the dressing (too tight, loose, gets wet, etc.) please contact Dr. Isiah office. DO NOT get the dressing wet.  For showers you can use an over-the-counter cast cover or wrap a washcloth around the top of your dressing and then cover it with a plastic bag and tape it to your leg. DO NOT soak the incision (no tubs, pools, bath, etc.) until you have approval from Dr. ELSA.  Contact Dr. Nadara office or go to Emergency Room if: Temperature above 101 F. Increasing pain that is unresponsive to pain medication or elevation Excessive redness or  swelling in your foot Dressing problems - excessive bloody drainage, looseness or tightness, or if dressing gets wet Develop pain, swelling, warmth, or discoloration of your calf

## 2023-12-10 NOTE — Plan of Care (Signed)

## 2023-12-10 NOTE — Evaluation (Signed)
 Physical Therapy Evaluation and Discharge Patient Details Name: Chris Carlson MRN: [id number] DOB: 11/19/1985 Today's Date: 12/10/2023  History of Present Illness  38 y.o. 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 S/p hardware removal 8/12. PMH arthritis, dysrhythmia, hyperlipidemia, HTN, obesity  Clinical Impression  Patient evaluated by Physical Therapy with no further acute PT needs identified. Pt presents with fair pain control and demonstrates good adherence to weightbearing precautions with mobility. Trialed short distance ambulation with crutches vs walker, with pt demonstrating slightly improved stability with RW. Also has access to a knee scooter that could be helpful for longer distance and reducing burden of hopping. Pt has good family support at home. Reviewed elevation, open chain exercises, and fall prevention and home set up. All education has been completed and the patient has no further questions. Pt will benefit from follow up OPPT when weightbearing restrictions are lifted. See below for any follow-up Physical Therapy or equipment needs. PT is signing off. Thank you for this referral.       If plan is discharge home, recommend the following: A little help with walking and/or transfers;A little help with bathing/dressing/bathroom;Assistance with cooking/housework;Assist for transportation;Help with stairs or ramp for entrance   Can travel by private vehicle        Equipment Recommendations Rolling walker (2 wheels) (bariatric)  Recommendations for Other Services       Functional Status Assessment Patient has had a recent decline in their functional status and demonstrates the ability to make significant improvements in function in a reasonable and predictable amount of time.     Precautions / Restrictions  Precautions Precautions: Fall;Other (comment) Recall of Precautions/Restrictions: Intact Precaution/Restrictions Comments: R ankle JP drain Restrictions Weight Bearing Restrictions Per Provider Order: Yes RLE Weight Bearing Per Provider Order: Non weight bearing      Mobility  Bed Mobility Overal bed mobility: Modified Independent                  Transfers Overall transfer level: Needs assistance Equipment used: Rolling walker (2 wheels), Crutches Transfers: Sit to/from Stand Sit to Stand: Supervision, Contact guard assist           General transfer comment: Supervision with RW, CGA for crutches. Verbal cues for hand and foot placement    Ambulation/Gait Ambulation/Gait assistance: Contact guard assist Gait Distance (Feet): 30 Feet (30, 10) Assistive device: Rolling walker (2 wheels), Crutches Gait Pattern/deviations: Step-to pattern Gait velocity: decreased     General Gait Details: Hop to pattern, verbal cues and visual demonstration provided for AD use. CGA for safety/stability with both RW and crutches.  Stairs            Wheelchair Mobility     Tilt Bed    Modified Rankin (Stroke Patients Only)       Balance Overall balance assessment: Needs assistance Sitting-balance support: Feet supported Sitting balance-Leahy Scale: Good     Standing balance support: Bilateral upper extremity supported Standing balance-Leahy Scale: Poor Standing balance comment: reliant on AD                             Pertinent Vitals/Pain Pain Assessment Pain Assessment: 0-10 Pain Score: 5  Pain Location: R ankle Pain Descriptors / Indicators: Aching, Constant Pain Intervention(s): Limited activity within patient's tolerance, Monitored during session, Premedicated before session  Home Living Family/patient expects to be discharged to:: Private residence Living Arrangements: Spouse/significant other;Other relatives Available Help at  Discharge: Family;Available 24 hours/day Type of Home: House Home Access: Stairs to enter   Entrance Stairs-Number of Steps: 1+1 Alternate Level Stairs-Number of Steps: flight Home Layout: Two level;1/2 bath on main level;Other (Comment) (walk in shower upstairs) Home Equipment: Shower seat;Hand held shower head;Grab bars - tub/shower;Other (comment);Crutches (knee scooter) Additional Comments: Pt lives with wife and her father, someone is available 24/7 to assist with ADLs/IADLs, wife can assist with transfers.    Prior Function Prior Level of Function : Needs assist             Mobility Comments: used cane ADLs Comments: help with socks/shoes,     Extremity/Trunk Assessment   Upper Extremity Assessment Upper Extremity Assessment: Overall WFL for tasks assessed    Lower Extremity Assessment Lower Extremity Assessment: RLE deficits/detail RLE Deficits / Details: Cast applied. Hip/knee WFL    Cervical / Trunk Assessment Cervical / Trunk Assessment: Other exceptions Cervical / Trunk Exceptions: increased body habitus  Communication   Communication Communication: No apparent difficulties    Cognition Arousal: Alert Behavior During Therapy: WFL for tasks assessed/performed   PT - Cognitive impairments: No apparent impairments                         Following commands: Intact       Cueing Cueing Techniques: Verbal cues     General Comments      Exercises     Assessment/Plan    PT Assessment Patient does not need any further PT services  PT Problem List         PT Treatment Interventions      PT Goals (Current goals can be found in the Care Plan section)  Acute Rehab PT Goals Patient Stated Goal: return to work next week PT Goal Formulation: All assessment and education complete, DC therapy    Frequency       Co-evaluation               AM-PAC PT 6 Clicks Mobility  Outcome Measure Help needed turning from your back to your  side while in a flat bed without using bedrails?: None Help needed moving from lying on your back to sitting on the side of a flat bed without using bedrails?: None Help needed moving to and from a bed to a chair (including a wheelchair)?: A Little Help needed standing up from a chair using your arms (e.g., wheelchair or bedside chair)?: A Little Help needed to walk in hospital room?: A Little Help needed climbing 3-5 steps with a railing? : A Lot 6 Click Score: 19    End of Session Equipment Utilized During Treatment: Gait belt Activity Tolerance: Patient tolerated treatment well Patient left: in bed;with call bell/phone within reach;Other (comment) (with OT) Nurse Communication: Mobility status PT Visit Diagnosis: Pain;Difficulty in walking, not elsewhere classified (R26.2) Pain - Right/Left: Right Pain - part of body: Ankle and joints of foot    Time: 8899-8877 PT Time Calculation (min) (ACUTE ONLY): 22 min   Charges:   PT Evaluation $PT Eval Low Complexity: 1 Low   PT General Charges $$ ACUTE PT VISIT: 1 Visit         Aleck Daring, PT, DPT Acute Rehabilitation Services Office [phone]   Alayne ONEIDA Daring 12/10/2023, 12:09 PM

## 2023-12-10 NOTE — Progress Notes (Signed)
     Chris Carlson is a 38 y.o. male   Orthopaedic diagnosis: status post right ankle hardware removal, repair of fibula nonunion, ankle arthrodesis, and tibiofibular joint arthrodesis 12/09/2023  Subjective: Patient resting comfortably.  Pain well controlled.  Family at bedside.  Reports output from drain overnight.  Hopeful for home today. No concerns otherwise.  Objectyive: Vitals:   12/10/23 0409 12/10/23 0756  BP: 100/62 118/68  Pulse: 96 (!) 101  Resp: 19 19  Temp: 98.2 F (36.8 C) 98.6 F (37 C)  SpO2: 96% 96%     Exam: Awake and alert Respirations even and unlabored No acute distress  Examination of right lower leg shows short leg splint and drain in place.  AP drain with output noted.  He is able to wiggle the toes and notes intact sensation to light touch in the toes.  Tolerates gentle range of motion at the knee.  Toes are warm and well perfused.  Exposed skin is benign.  Assessment: Postop day 1 status post above, doing well  Plan: Patient doing well.  Pain is controlled.  He will work with physical therapy today.  Hopeful for discharge home today.  We will keep the drain in place as it does have adequate output.  Family at bedside understands how to care and emptied the drain at home. If he does well with therapy and is suitable for discharge home today we will see him on Friday this week for drain removal.  -nonweightbearing right lower extremity -Keep the splint and drain in place. Empty drain every 4 hours or as needed. -Oxycodone  for pain control -Lovenox  for DVT prophylaxis in patient, aspirin  for DVT prophylaxis outpatient.   Danese Dorsainvil J. [place], PA-C

## 2023-12-10 NOTE — TOC Transition Note (Signed)
 Transition of Care [facility]) - Discharge Note   Patient Details  Name: Chris Carlson MRN: [id number] Date of Birth: 1985/07/12  Transition of Care [facility]) CM/SW Contact:  Roxie KANDICE Stain, RN Phone Number: 12/10/2023, 1:20 PM   Clinical Narrative:    Patient stable for discharge. Patient is agreeable for DME- bariatric walker. Notified Zack with [employer].  PCP confirmed.  Final next level of care: Home/Self Care Barriers to Discharge: Barriers Resolved   Patient Goals and CMS Choice Patient states their goals for this hospitalization and ongoing recovery are:: return home          Discharge Placement               home        Discharge Plan and Services Additional resources added to the After Visit Summary for                  DME Arranged: Walker rolling DME Agency: [employer] Date DME Agency Contacted: 12/10/23 Time DME Agency Contacted: 1320 Representative spoke with at DME Agency: Zack            Social Drivers of Health (SDOH) Interventions SDOH Screenings   Food Insecurity: Patient Declined (12/09/2023)  Housing: Low Risk  (12/09/2023)  Transportation Needs: No Transportation Needs (12/09/2023)  Utilities: Not At Risk (12/09/2023)  Alcohol Screen: Low Risk  (11/20/2022)  Depression (PHQ2-9): Low Risk  (11/20/2022)  Physical Activity: Insufficiently Active (11/20/2022)  Tobacco Use: Low Risk  (12/09/2023)     Readmission Risk Interventions     No data to display

## 2023-12-12 ENCOUNTER — Encounter (HOSPITAL_COMMUNITY): Payer: Self-pay | Admitting: Orthopaedic Surgery

## 2023-12-12 NOTE — Op Note (Signed)
 Chris Carlson male 38 y.o. 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   PostOperative Diagnosis: Same  PROCEDURE: Right lateral leg deep orthopedic hardware removal Repair of fibular shaft nonunion with internal fixation Deep orthopedic hardware removal, right medial leg through separate incision Right tibiotalar joint arthrodesis Right distal tibiofibular arthrodesis  SURGEON: Lonni Pae, MD  ASSISTANT: Jesse [place], PA-C was necessary for patient positioning, prep, drape and assistance with reduction and placement of hardware  ANESTHESIA: General With peripheral nerve block  FINDINGS: See below  IMPLANTS: Arthrex recon plate with nonlocking screws, tibiotalar joint arthrodesis plate, fully threaded screws for distal tibiofibular joint arthrodesis  INDICATIONS:37 y.o. male sustained a severe ankle fracture years ago and underwent open treatment by outside provider.  Unfortunately he went on to collapse, fibular nonunion and hardware failure medially and laterally.  He was indicated for surgery due to the severity of the posttraumatic arthrosis due to collapse and nonunion.   Patient understood the risks, benefits and alternatives to surgery which include but are not limited to wound healing complications, infection, nonunion, malunion, need for further surgery as well as damage to surrounding structures. They also understood the potential for continued pain in that there were no guarantees of acceptable outcome After weighing these risks the patient opted to proceed with surgery.  PROCEDURE: Patient was identified in the preoperative holding area.  The right leg was marked by myself.  Consent was signed by myself and the patient.  Block was  performed by anesthesia in the preoperative holding area.  Patient was taken to the operative suite and placed supine on the operative table.  General anesthesia was induced without difficulty. Bump was placed under the operative hip and bone foam was used.  All bony prominences were well padded.  Tourniquet was placed on the operative thigh.  Preoperative antibiotics were given. The extremity was prepped and draped in the usual sterile fashion and surgical timeout was performed.  The limb was elevated and the tourniquet was inflated to 250 mmHg.  Deep orthopedic hardware removal, lateral leg:  We began by making a longitudinal incision overlying the previously made incision of the fibula.  The incision was carried sharply through skin and subcutaneous tissue.  Blunt dissection was used to mobilize skin flaps and care was taken to identify and protect branches of the superficial peroneal nerve.  We are able to carry the dissection down to the fibula.  The hardware was exposed and it was fractured at the site of the nonunion.  The screws were sequentially removed and the plate was removed from the bone.  There was some bony overgrowth and significant mount of hypertrophy at the area of the nonunion.  The hardware was discarded.  Repair of fibular nonunion:  We then proceeded to inspect and repair of the nonunion.  The hypertrophic bone and interposed tissue was removed from the nonunion site.  Then the bony surfaces were roughened until they were bleeding.  Then a recon plate was placed on the lateral fibula to stabilize the nonunion site.  Fluoroscopy helped confirm acceptable reduction of the nonunion site and placement of the hardware.  Screws were placed above and below the nonunion site and there was good bony apposition and reduction of the fibula and nonunion.  Deep orthopedic hardware, medial leg:  We then turned our attention to the  medial ankle.  He had a previous traumatic wound and incision along  the anterior aspect of the leg that extended down to the medial side of the ankle and leg.  This prior incision was used to open up the anterior medial aspect of the leg and ankle.  The incision was then carried in a curvilinear fashion back to the dorsal foot for later exposure to the ankle joint.  Through this incision were able to create skin flaps and gain access to the tibia.  Interval between the medial skin flap and the medial tibia was carried down and the hardware that was placed along the medial tibia was identified.  The distal screws from the plate were removed uneventfully.  There was a combination of locking and nonlocking screws on this area and they were removed and discarded.  The remaining plate was left in place.  Tibiotalar joint arthrodesis:  We then proceeded to identify the interval between the tibialis anterior and the extensor houses longus tendons and the retinacular tissue and this area was incised in line with the tendons.  Then the retinacular tissue was opened and the tendons were retracted.  The neurovascular bundle deep to the EHL tendon was identified, elevated and protected through the entire to the case.  Then the joint capsule overlying the tibiotalar joint was identified and incised in line with the incision.  The joint was fully mobilized and the capsular tissue was elevated gaining exposure to the entire ankle joint.  There was some collapse notable of the lateral tibial plafond.  Lamina spreader was placed within the joint and all of the cartilage was removed from the tibial plafond and dorsal talus.    Then using a drill and an osteotome the tibial plafond and dorsal talus was trephinated and a roughened to break through the subchondral bone to expose underlying cancellous bony surfaces for arthrodesis purposes and joint prep.  Then the joint was reduced under direct visualization and compression was used.  Then a K wire was placed from the medial side across the joint  to stabilize the joint provisionally.  Fluoroscopy confirmed appropriate reduction of the joint.  Then a tibiotalar joint arthrodesis plate was chosen and placed anteriorly.  After confirmation of acceptable positioning combination of locking and nonlocking screws were used through the plate and compressive modalities through the plate were used to fixate the arthrodesis site.  Screw lengths were confirmed fluoroscopically.  Then K wire was placed from the medial aspect of the tibia across the tibiotalar joint and a large partially-threaded cannulated screw was placed for compressive purposes and added fixation through the tibiotalar arthrodesis site.  Tibiofibular joint arthrodesis:  We then turned our attention to the distal tibiofibular joint.  Separate incision was carried more anteriorly from the lateral incision and more laterally from the anterior incision to gain access to the tibial fibular joint.  Using a rondure and a curette the interposed fibrous tissue and cartilage surfaces in this joint were removed.  Then the joint was prepped and trephinated.  Then the joint was compressed using a Weber clamp and 2 fully threaded screws were placed through the previously placed recon plate to stabilize and fixate the tibiofibular joint arthrodesis site.  After placement of all hardware the joints were inspected and found to be acceptably compressed and reduced.  Final fluoroscopic images were obtained.  The tourniquet was released.  Tourniquet time was 2 hours.  The wounds were then irrigated with normal saline.  We then proceeded  to closed the wounds in a layered fashion using 2-0 Vicryl, 3-0 Monocryl and 3-0 nylon suture.  Prior to complete closure a JP drain was placed within the anterior incision and run through the skin along the lateral aspect of the dorsal foot.  Suction bulb was placed.  The wounds were then completely closed.  Soft dressing and short leg splint were placed.  He was then awakened  from anesthesia and taken recovery in stable condition.  No complications.  POST OPERATIVE INSTRUCTIONS: Nonweightbearing to operative extremity Keep splint dry Will monitor drain output and remove once output has decreased Patient will be admitted for observation to work with therapy. DVT prophylaxis  TOURNIQUET TIME: 2 hours  BLOOD LOSS:  less than 50 mL         DRAINS: none         SPECIMEN: none       COMPLICATIONS:  * No complications entered in OR log *         Disposition: PACU - hemodynamically stable.         Condition: stable

## 2023-12-16 ENCOUNTER — Encounter (HOSPITAL_COMMUNITY): Payer: Self-pay | Admitting: Orthopaedic Surgery

## 2023-12-17 ENCOUNTER — Encounter (HOSPITAL_COMMUNITY): Payer: Self-pay | Admitting: Orthopaedic Surgery

## 2023-12-18 MED ORDER — ROPIVACAINE HCL 5 MG/ML IJ SOLN
INTRAMUSCULAR | Status: DC | PRN
Start: 2023-12-09 — End: 2023-12-18
  Administered 2023-12-09: 40 mL via PERINEURAL

## 2023-12-18 MED ORDER — DEXAMETHASONE SODIUM PHOSPHATE 10 MG/ML IJ SOLN
INTRAMUSCULAR | Status: DC | PRN
Start: 2023-12-09 — End: 2023-12-18
  Administered 2023-12-09: 10 mg

## 2023-12-18 NOTE — Anesthesia Procedure Notes (Signed)
 Anesthesia Regional Block: Popliteal block   Pre-Anesthetic Checklist: , timeout performed,  Correct Patient, Correct Site, Correct Laterality,  Correct Procedure, Correct Position, site marked,  Risks and benefits discussed,  Surgical consent,  Pre-op evaluation,  At surgeon's request and post-op pain management  Laterality: Right  Prep: Maximum Sterile Barrier Precautions used, chloraprep       Needles:  Injection technique: Single-shot  Needle Type: Echogenic Stimulator Needle     Needle Length: 9cm  Needle Gauge: 22     Additional Needles:   Procedures:,,,, ultrasound used (permanent image in chart),,    Narrative:  Start time: 12/09/2023 10:50 AM End time: 12/09/2023 10:55 AM Injection made incrementally with aspirations every 5 mL.  Performed by: Personally  Anesthesiologist: Merla Almarie HERO, DO  Additional Notes: Monitors applied. No increased pain on injection. No increased resistance to injection. Injection made in 5cc increments. Good needle visualization. Patient tolerated procedure well.

## 2023-12-18 NOTE — Addendum Note (Signed)
 Addendum  created 12/18/23 1345 by Merla Almarie HERO, DO   Child order released for a procedure order, Clinical Note Signed, Intraprocedure Blocks edited, Intraprocedure Meds edited, SmartForm saved

## 2023-12-18 NOTE — Anesthesia Procedure Notes (Signed)
 Anesthesia Regional Block: Adductor canal block   Pre-Anesthetic Checklist: , timeout performed,  Correct Patient, Correct Site, Correct Laterality,  Correct Procedure, Correct Position, site marked,  Risks and benefits discussed,  Surgical consent,  Pre-op evaluation,  At surgeon's request and post-op pain management  Laterality: Right  Prep: Maximum Sterile Barrier Precautions used, chloraprep       Needles:  Injection technique: Single-shot  Needle Type: Echogenic Stimulator Needle     Needle Length: 9cm  Needle Gauge: 22     Additional Needles:   Procedures:,,,, ultrasound used (permanent image in chart),,    Narrative:  Start time: 12/09/2023 11:05 AM End time: 12/09/2023 11:10 AM Injection made incrementally with aspirations every 5 mL.  Performed by: Personally  Anesthesiologist: Merla Almarie HERO, DO  Additional Notes: Monitors applied. No increased pain on injection. No increased resistance to injection. Injection made in 5cc increments. Good needle visualization. Patient tolerated procedure well.

## 2023-12-23 DIAGNOSIS — M19071 Primary osteoarthritis, right ankle and foot: Secondary | ICD-10-CM | POA: Diagnosis not present

## 2024-01-15 ENCOUNTER — Ambulatory Visit (INDEPENDENT_AMBULATORY_CARE_PROVIDER_SITE_OTHER): Payer: BC Managed Care – PPO | Admitting: Medical

## 2024-01-15 ENCOUNTER — Encounter: Payer: Self-pay | Admitting: Medical

## 2024-01-15 VITALS — BP 118/80 | HR 87 | Ht 69.0 in | Wt 380.0 lb

## 2024-01-15 DIAGNOSIS — Z23 Encounter for immunization: Secondary | ICD-10-CM | POA: Diagnosis not present

## 2024-01-15 DIAGNOSIS — E785 Hyperlipidemia, unspecified: Secondary | ICD-10-CM | POA: Diagnosis not present

## 2024-01-15 DIAGNOSIS — R5383 Other fatigue: Secondary | ICD-10-CM

## 2024-01-15 DIAGNOSIS — Z6841 Body Mass Index (BMI) 40.0 and over, adult: Secondary | ICD-10-CM

## 2024-01-15 DIAGNOSIS — D649 Anemia, unspecified: Secondary | ICD-10-CM | POA: Diagnosis not present

## 2024-01-15 DIAGNOSIS — R7301 Impaired fasting glucose: Secondary | ICD-10-CM

## 2024-01-15 DIAGNOSIS — Z0000 Encounter for general adult medical examination without abnormal findings: Secondary | ICD-10-CM

## 2024-01-15 DIAGNOSIS — I10 Essential (primary) hypertension: Secondary | ICD-10-CM

## 2024-01-15 LAB — LIPID PANEL

## 2024-01-15 MED ORDER — IBUPROFEN 600 MG PO TABS: 600.0000 mg | ORAL_TABLET | Freq: Three times a day (TID) | ORAL | 0 refills | Status: AC | PRN

## 2024-01-15 NOTE — Progress Notes (Signed)
Placed referral to nutritionist.

## 2024-01-15 NOTE — Progress Notes (Signed)
 Subjective: Chief Complaint  Patient presents with   Annual Exam    Fasting cpe, no concerns   History of Present Illness Chris Carlson is a 38 year old male who presents for a well visit with concerns about fertility.  Here with wife.  He is concerned about fertility and mentions needing another sperm count. He previously visited a fertility clinic in [place] but does not have a specific preference for a doctor at this time.  He has a history of hypertension and is currently taking valsartan  HCT 80/12.5 mg regularly. He also takes aspirin  and Lipitor 40 mg daily for hyperlipidemia. He denies smoking and alcohol use.  He has a history of anemia, which has been persistent. He reports not seeing blood in his stool. He had foot surgery in August, which involved some blood loss due to a drain tube. He admits to poor dietary habits, lacking in vegetables, nuts, and beans, which may contribute to his anemia.  He has a history of prediabetes with a diabetes marker of 6.1% last year. His blood sugar was slightly elevated in a recent electrolyte panel, though he was unsure if he was fasting at the time.  He has experienced significant weight loss, from 409 pounds a year ago to 380 pounds currently. He was previously on Wegovy  for weight management but faced issues with insurance coverage and pharmacy access. He is open to resuming medication for weight management.  He works for [employer] and attempts light walking for exercise. He inquires about a prescription for ibuprofen  800 mg, as he currently takes four 200 mg tablets to manage pain.   No Known Allergies  Past Medical History:  Diagnosis Date   Arthritis    Right Ankle   Dysrhythmia    occasional palpitations   Hyperlipidemia    Hypertension    Obesity     Current Outpatient Medications on File Prior to Visit  Medication Sig Dispense Refill   aspirin  (BAYER ASPIRIN ) 325 MG tablet Take 1 tablet by mouth for 30 DAYS for blood  clot prevention 30 tablet 0   atorvastatin  (LIPITOR) 40 MG tablet Take 1 tablet (40 mg total) by mouth daily. 90 tablet 3   ibuprofen  (ADVIL ) 200 MG tablet Take 400 mg by mouth every 6 (six) hours as needed for moderate pain (pain score 4-6).     omeprazole  (PRILOSEC) 40 MG capsule Take 1 capsule (40 mg total) by mouth daily. 90 capsule 3   oxyCODONE  (ROXICODONE ) 5 MG immediate release tablet Take 1 tablet (5 mg total) by mouth every 4 (four) hours as needed for severe pain (pain score 7-10). 10 tablet 0   acetaminophen  (TYLENOL ) 500 MG tablet Take 1,000 mg by mouth every 6 (six) hours as needed for moderate pain (pain score 4-6).     oxyCODONE  (ROXICODONE ) 5 MG immediate release tablet Take 1 tablet (5 mg total) by mouth every 4 (four) hours as needed. 30 tablet 0   No current facility-administered medications on file prior to visit.      Current Outpatient Medications:    aspirin  (BAYER ASPIRIN ) 325 MG tablet, Take 1 tablet by mouth for 30 DAYS for blood clot prevention, Disp: 30 tablet, Rfl: 0   atorvastatin  (LIPITOR) 40 MG tablet, Take 1 tablet (40 mg total) by mouth daily., Disp: 90 tablet, Rfl: 3   ibuprofen  (ADVIL ) 200 MG tablet, Take 400 mg by mouth every 6 (six) hours as needed for moderate pain (pain score 4-6)., Disp: , Rfl:  ibuprofen  (ADVIL ) 600 MG tablet, Take 1 tablet (600 mg total) by mouth every 8 (eight) hours as needed., Disp: 30 tablet, Rfl: 0   omeprazole  (PRILOSEC) 40 MG capsule, Take 1 capsule (40 mg total) by mouth daily., Disp: 90 capsule, Rfl: 3   oxyCODONE  (ROXICODONE ) 5 MG immediate release tablet, Take 1 tablet (5 mg total) by mouth every 4 (four) hours as needed for severe pain (pain score 7-10)., Disp: 10 tablet, Rfl: 0   acetaminophen  (TYLENOL ) 500 MG tablet, Take 1,000 mg by mouth every 6 (six) hours as needed for moderate pain (pain score 4-6)., Disp: , Rfl:    oxyCODONE  (ROXICODONE ) 5 MG immediate release tablet, Take 1 tablet (5 mg total) by mouth every 4  (four) hours as needed., Disp: 30 tablet, Rfl: 0  Family History  Problem Relation Age of Onset   Anemia Mother    Hypertension Maternal Grandmother    Diabetes Maternal Grandmother    Cancer Maternal Grandmother        breast   Cancer Maternal Grandfather        prostate   Diabetes Paternal Grandmother    Hypertension Paternal Grandmother    Cancer Paternal Grandfather        prostate   Heart disease Paternal Grandfather        MI   Stroke Neg Hx     Past Surgical History:  Procedure Laterality Date   ACHILLES TENDON LENGTHENING Right 12/09/2023   Procedure: LENGTHENING, TENDON, ACHILLES;  Surgeon: Elsa Lonni SAUNDERS, MD;  Location: [facility] OR;  Service: Orthopedics;  Laterality: Right;   ANKLE FRACTURE SURGERY Right 2012   ANKLE FUSION Right 12/09/2023   Procedure: ARTHRODESIS ANKLE;  Surgeon: Elsa Lonni SAUNDERS, MD;  Location: [facility] OR;  Service: Orthopedics;  Laterality: Right;   HARDWARE REMOVAL Right 12/09/2023   Procedure: REMOVAL, HARDWARE;  Surgeon: Elsa Lonni SAUNDERS, MD;  Location: [facility] OR;  Service: Orthopedics;  Laterality: Right;  Repair non-union fibula, right   Review of Systems  Constitutional:  Negative for chills, fever, malaise/fatigue and weight loss.  HENT:  Negative for congestion, ear pain, hearing loss, sore throat and tinnitus.   Eyes:  Negative for blurred vision, pain and redness.  Respiratory:  Negative for cough, hemoptysis and shortness of breath.   Cardiovascular:  Negative for chest pain, palpitations, orthopnea, claudication and leg swelling.  Gastrointestinal:  Negative for abdominal pain, blood in stool, constipation, diarrhea, nausea and vomiting.  Genitourinary:  Negative for dysuria, flank pain, frequency, hematuria and urgency.  Musculoskeletal:  Positive for joint pain. Negative for falls and myalgias.  Skin:  Negative for itching and rash.  Neurological:  Negative for dizziness, tingling, speech change, weakness and headaches.   Endo/Heme/Allergies:  Negative for polydipsia. Does not bruise/bleed easily.  Psychiatric/Behavioral:  Negative for depression and memory loss. The patient is not nervous/anxious and does not have insomnia.     Objective: BP 118/80   Pulse 87   Ht 5' 9 (1.753 m)   Wt (!) 380 lb (172.4 kg)   SpO2 99%   BMI 56.12 kg/m   General appearence: alert, no distress, WD/WN, African American male Seated with cast on right foot and ankle Skin:scattered hypopigmented patches of neck anterior, posterior suggestive of tinea versicolor HEENT: normocephalic, sclerae anicteric, PERRLA, EOMi, nares patent, no discharge or erythema, pharynx normal Oral cavity: MMM, no lesions Neck: supple, no lymphadenopathy, no thyromegaly, no masses Heart: RRR, normal S1, S2, no murmurs Lungs: CTA bilaterally, no wheezes, rhonchi, or rales  Abdomen: +bs, soft, non tender, non distended, no masses, no hepatomegaly, no splenomegaly Back: non tender Musculoskeletal: nontender, no swelling, no obvious deformity Extremities: no edema, no cyanosis, no clubbing Pulses: 2+ symmetric, upper and lower extremities, normal cap refill Neurological: alert, oriented x 3, CN2-12 intact, strength normal upper extremities and lower extremities, sensation normal throughout, DTRs 2+ throughout, no cerebellar signs, gait normal Psychiatric: normal affect, behavior normal, pleasant  GU/rectal - deferred   Assessment and Plan Encounter Diagnoses  Name Primary?   Encounter for health maintenance examination in adult Yes   Dyslipidemia    Essential hypertension, benign    Other fatigue    Impaired fasting blood sugar    BMI 50.0-59.9, adult (HCC)    Anemia, unspecified type    Needs flu shot     Essential hypertension Blood pressure elevated compared to last year. Emphasized medication adherence. - Continue valsartan  HCT 80/12.5 mg daily. - Monitor blood pressure regularly.  Obesity BMI 56. Previous Wegovy  use delayed by  insurance. Discussed Zepbound as alternatives, with Zepbound offering additional benefits for sleep apnea and obesity. - Recommend resuming Wegovy  if insurance allows. - Consider Mounjaro or Zepbound if insurance covers. - Recommend nutrition consult for dietary improvement.  Prediabetes Slightly elevated blood sugar recently on labs. Unclear fasting status during testing. Discussed dietary impact on management. - Order A1c test. - Recommend nutrition consult for dietary improvement.  Hyperlipidemia, unspecified Poor cholesterol levels last year. Emphasized dietary changes. - Continue Lipitor 40 mg daily. - Order cholesterol panel. - Recommend nutrition consult for dietary improvement.  Anemia, unspecified Chronic mild anemia possibly due to low iron. Negative stool blood test. Discussed further evaluation and potential GI consult if anemia persists. - Order labs for iron, folate, and B12. - Recommend nutrition consult for dietary improvement. - Consider GI consult for colonoscopy if anemia persists.  Male infertility, unspecified Concerns about fertility. Previous evaluation in [place]. Discussed referral to [facility]. - Recommend contacting [facility] for evaluation. - Provide contact information for [facility].  General Health Maintenance Discussed vaccinations and cancer screenings. Tetanus up to date. Recommended flu shot and testicular self-exams. - Administer flu vaccine. - Recommend monthly testicular self-exams.  Ibuprofen  Prescription Requested prescription due to difficulty finding higher doses over-the-counter. - Prescribe ibuprofen  600 mg.   Kalid was seen today for annual exam.  Diagnoses and all orders for this visit:  Encounter for health maintenance examination in adult -     Lipid panel -     TSH + free T4 -     Hemoglobin A1c -     Testosterone  -     Iron, TIBC and Ferritin Panel -      Folate -     Vitamin B12 -     Hepatic function panel  Dyslipidemia -     Lipid panel  Essential hypertension, benign  Other fatigue -     TSH + free T4 -     Testosterone   Impaired fasting blood sugar -     Hemoglobin A1c  BMI 50.0-59.9, adult (HCC)  Anemia, unspecified type -     Iron, TIBC and Ferritin Panel -     Folate -     Vitamin B12  Needs flu shot -     Flu vaccine trivalent PF, 6mos and older(Flulaval,Afluria,Fluarix,Fluzone)  Other orders -     ibuprofen  (ADVIL ) 600 MG tablet; Take 1 tablet (600 mg total) by mouth every 8 (eight) hours as needed.  F/u pending labs

## 2024-01-15 NOTE — Addendum Note (Signed)
 Addended by: VICCI HUSBAND A on: 01/15/2024 11:58 AM   Modules accepted: Orders

## 2024-01-16 ENCOUNTER — Other Ambulatory Visit: Payer: Self-pay | Admitting: Medical

## 2024-01-16 ENCOUNTER — Ambulatory Visit: Payer: Self-pay | Admitting: Medical

## 2024-01-16 DIAGNOSIS — M19071 Primary osteoarthritis, right ankle and foot: Secondary | ICD-10-CM | POA: Diagnosis not present

## 2024-01-16 LAB — HEPATIC FUNCTION PANEL
ALT: 19 IU/L (ref 0–44)
AST: 17 IU/L (ref 0–40)
Albumin: 4 g/dL — AB (ref 4.1–5.1)
Alkaline Phosphatase: 57 IU/L (ref 47–123)
Bilirubin Total: <0.2 mg/dL (ref 0.0–1.2)
Bilirubin, Direct: <0.08 mg/dL (ref 0.00–0.40)
Total Protein: 7.3 g/dL (ref 6.0–8.5)

## 2024-01-16 LAB — IRON,TIBC AND FERRITIN PANEL
Ferritin: 459 ng/mL — AB (ref 30–400)
Iron Saturation: 25 % (ref 15–55)
Iron: 55 ug/dL (ref 38–169)
Total Iron Binding Capacity: 223 ug/dL — AB (ref 250–450)
UIBC: 168 ug/dL (ref 111–343)

## 2024-01-16 LAB — FOLATE: Folate: 3.7 ng/mL (ref >3.0)

## 2024-01-16 LAB — LIPID PANEL
Cholesterol, Total: 214 mg/dL — AB (ref 100–199)
HDL: 36 mg/dL — AB (ref >39)
LDL CALC COMMENT:: 5.9 ratio — AB (ref 0.0–5.0)
LDL Chol Calc (NIH): 159 mg/dL — AB (ref 0–99)
Triglycerides: 102 mg/dL (ref 0–149)
VLDL Cholesterol Cal: 19 mg/dL (ref 5–40)

## 2024-01-16 LAB — TESTOSTERONE: Testosterone: 539 ng/dL (ref 264–916)

## 2024-01-16 LAB — TSH+FREE T4
Free T4: 1.11 ng/dL (ref 0.82–1.77)
TSH: 2.69 u[IU]/mL (ref 0.450–4.500)

## 2024-01-16 LAB — VITAMIN B12: Vitamin B-12: 305 pg/mL (ref 232–1245)

## 2024-01-16 LAB — HEMOGLOBIN A1C
Est. average glucose Bld gHb Est-mCnc: 111 mg/dL
Hgb A1c MFr Bld: 5.5 % (ref 4.8–5.6)

## 2024-01-16 MED ORDER — ROSUVASTATIN CALCIUM 20 MG PO TABS: 20.0000 mg | ORAL_TABLET | Freq: Every day | ORAL | 0 refills | Status: AC

## 2024-01-16 MED ORDER — MULTIVITAMIN ADULT PO TABS: 1.0000 | ORAL_TABLET | Freq: Every day | ORAL | 3 refills | Status: AC

## 2024-01-16 MED ORDER — FOLIC ACID 1 MG PO TABS: 1.0000 mg | ORAL_TABLET | Freq: Every day | ORAL | 0 refills | Status: DC

## 2024-01-16 NOTE — Progress Notes (Signed)
  Cholesterol numbers are still too high your ferritin and iron stores are elevated, liver test okay except albumin  low.  Testosterone  okay.  Folate barely okay.  Thyroid  and diabetes marker okay  I think your biggest issue including the fertility concern is nutrition.  Continue plan to see nutritionist.  Expect a phone call about scheduling.  If you have not heard back within a week let me know  I sent a couple things to the pharmacy.  Lets change to Crestor  cholesterol medicine instead of Lipitor.  Begin a daily multivitamin and daily folic acid  prescription is well  Lets make some significant diet changes.  Like to see you back in 3 months to recheck fasting

## 2024-01-20 ENCOUNTER — Other Ambulatory Visit: Payer: Self-pay | Admitting: Medical

## 2024-01-23 DIAGNOSIS — M19071 Primary osteoarthritis, right ankle and foot: Secondary | ICD-10-CM | POA: Diagnosis not present

## 2024-01-28 DIAGNOSIS — R262 Difficulty in walking, not elsewhere classified: Secondary | ICD-10-CM | POA: Diagnosis not present

## 2024-01-28 DIAGNOSIS — M25671 Stiffness of right ankle, not elsewhere classified: Secondary | ICD-10-CM | POA: Diagnosis not present

## 2024-01-28 DIAGNOSIS — R531 Weakness: Secondary | ICD-10-CM | POA: Diagnosis not present

## 2024-01-29 DIAGNOSIS — R531 Weakness: Secondary | ICD-10-CM | POA: Diagnosis not present

## 2024-01-29 DIAGNOSIS — M25671 Stiffness of right ankle, not elsewhere classified: Secondary | ICD-10-CM | POA: Diagnosis not present

## 2024-01-29 DIAGNOSIS — R262 Difficulty in walking, not elsewhere classified: Secondary | ICD-10-CM | POA: Diagnosis not present

## 2024-01-30 DIAGNOSIS — R531 Weakness: Secondary | ICD-10-CM | POA: Diagnosis not present

## 2024-01-30 DIAGNOSIS — R262 Difficulty in walking, not elsewhere classified: Secondary | ICD-10-CM | POA: Diagnosis not present

## 2024-01-30 DIAGNOSIS — M25671 Stiffness of right ankle, not elsewhere classified: Secondary | ICD-10-CM | POA: Diagnosis not present

## 2024-02-20 DIAGNOSIS — M25571 Pain in right ankle and joints of right foot: Secondary | ICD-10-CM | POA: Diagnosis not present

## 2024-02-20 DIAGNOSIS — Z4889 Encounter for other specified surgical aftercare: Secondary | ICD-10-CM | POA: Diagnosis not present

## 2024-02-23 DIAGNOSIS — M25671 Stiffness of right ankle, not elsewhere classified: Secondary | ICD-10-CM | POA: Diagnosis not present

## 2024-02-23 DIAGNOSIS — R531 Weakness: Secondary | ICD-10-CM | POA: Diagnosis not present

## 2024-02-23 DIAGNOSIS — R262 Difficulty in walking, not elsewhere classified: Secondary | ICD-10-CM | POA: Diagnosis not present

## 2024-02-25 DIAGNOSIS — R262 Difficulty in walking, not elsewhere classified: Secondary | ICD-10-CM | POA: Diagnosis not present

## 2024-02-25 DIAGNOSIS — R531 Weakness: Secondary | ICD-10-CM | POA: Diagnosis not present

## 2024-02-25 DIAGNOSIS — M25671 Stiffness of right ankle, not elsewhere classified: Secondary | ICD-10-CM | POA: Diagnosis not present

## 2024-03-02 DIAGNOSIS — R262 Difficulty in walking, not elsewhere classified: Secondary | ICD-10-CM | POA: Diagnosis not present

## 2024-03-02 DIAGNOSIS — R531 Weakness: Secondary | ICD-10-CM | POA: Diagnosis not present

## 2024-03-02 DIAGNOSIS — M25671 Stiffness of right ankle, not elsewhere classified: Secondary | ICD-10-CM | POA: Diagnosis not present

## 2024-03-04 DIAGNOSIS — R262 Difficulty in walking, not elsewhere classified: Secondary | ICD-10-CM | POA: Diagnosis not present

## 2024-03-04 DIAGNOSIS — R531 Weakness: Secondary | ICD-10-CM | POA: Diagnosis not present

## 2024-03-04 DIAGNOSIS — M25671 Stiffness of right ankle, not elsewhere classified: Secondary | ICD-10-CM | POA: Diagnosis not present

## 2024-03-08 ENCOUNTER — Encounter: Attending: Medical | Admitting: Skilled Nursing Facility1

## 2024-03-08 ENCOUNTER — Encounter: Payer: Self-pay | Admitting: Skilled Nursing Facility1

## 2024-03-08 DIAGNOSIS — R7301 Impaired fasting glucose: Secondary | ICD-10-CM | POA: Insufficient documentation
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
## 2024-03-09 DIAGNOSIS — M25671 Stiffness of right ankle, not elsewhere classified: Secondary | ICD-10-CM | POA: Diagnosis not present

## 2024-03-09 DIAGNOSIS — R262 Difficulty in walking, not elsewhere classified: Secondary | ICD-10-CM | POA: Diagnosis not present

## 2024-03-09 DIAGNOSIS — R531 Weakness: Secondary | ICD-10-CM | POA: Diagnosis not present

## 2024-03-11 DIAGNOSIS — R531 Weakness: Secondary | ICD-10-CM | POA: Diagnosis not present

## 2024-03-11 DIAGNOSIS — M25671 Stiffness of right ankle, not elsewhere classified: Secondary | ICD-10-CM | POA: Diagnosis not present

## 2024-03-11 DIAGNOSIS — R262 Difficulty in walking, not elsewhere classified: Secondary | ICD-10-CM | POA: Diagnosis not present

## 2024-03-16 DIAGNOSIS — R262 Difficulty in walking, not elsewhere classified: Secondary | ICD-10-CM | POA: Diagnosis not present

## 2024-03-16 DIAGNOSIS — R531 Weakness: Secondary | ICD-10-CM | POA: Diagnosis not present

## 2024-03-16 DIAGNOSIS — M25671 Stiffness of right ankle, not elsewhere classified: Secondary | ICD-10-CM | POA: Diagnosis not present

## 2024-03-18 DIAGNOSIS — R531 Weakness: Secondary | ICD-10-CM | POA: Diagnosis not present

## 2024-03-18 DIAGNOSIS — R262 Difficulty in walking, not elsewhere classified: Secondary | ICD-10-CM | POA: Diagnosis not present

## 2024-03-18 DIAGNOSIS — M25671 Stiffness of right ankle, not elsewhere classified: Secondary | ICD-10-CM | POA: Diagnosis not present

## 2024-03-29 DIAGNOSIS — M25671 Stiffness of right ankle, not elsewhere classified: Secondary | ICD-10-CM | POA: Diagnosis not present

## 2024-03-29 DIAGNOSIS — R262 Difficulty in walking, not elsewhere classified: Secondary | ICD-10-CM | POA: Diagnosis not present

## 2024-03-30 DIAGNOSIS — M25671 Stiffness of right ankle, not elsewhere classified: Secondary | ICD-10-CM | POA: Diagnosis not present

## 2024-03-30 DIAGNOSIS — R531 Weakness: Secondary | ICD-10-CM | POA: Diagnosis not present

## 2024-03-30 DIAGNOSIS — R262 Difficulty in walking, not elsewhere classified: Secondary | ICD-10-CM | POA: Diagnosis not present

## 2024-04-01 DIAGNOSIS — R531 Weakness: Secondary | ICD-10-CM | POA: Diagnosis not present

## 2024-04-01 DIAGNOSIS — M25671 Stiffness of right ankle, not elsewhere classified: Secondary | ICD-10-CM | POA: Diagnosis not present

## 2024-04-01 DIAGNOSIS — R262 Difficulty in walking, not elsewhere classified: Secondary | ICD-10-CM | POA: Diagnosis not present

## 2024-04-07 DIAGNOSIS — R262 Difficulty in walking, not elsewhere classified: Secondary | ICD-10-CM | POA: Diagnosis not present

## 2024-04-07 DIAGNOSIS — M25671 Stiffness of right ankle, not elsewhere classified: Secondary | ICD-10-CM | POA: Diagnosis not present

## 2024-04-07 DIAGNOSIS — R531 Weakness: Secondary | ICD-10-CM | POA: Diagnosis not present

## 2024-04-08 DIAGNOSIS — R531 Weakness: Secondary | ICD-10-CM | POA: Diagnosis not present

## 2024-04-08 DIAGNOSIS — R262 Difficulty in walking, not elsewhere classified: Secondary | ICD-10-CM | POA: Diagnosis not present

## 2024-04-08 DIAGNOSIS — M25671 Stiffness of right ankle, not elsewhere classified: Secondary | ICD-10-CM | POA: Diagnosis not present

## 2024-04-13 DIAGNOSIS — R262 Difficulty in walking, not elsewhere classified: Secondary | ICD-10-CM | POA: Diagnosis not present

## 2024-04-13 DIAGNOSIS — R531 Weakness: Secondary | ICD-10-CM | POA: Diagnosis not present

## 2024-04-13 DIAGNOSIS — M25671 Stiffness of right ankle, not elsewhere classified: Secondary | ICD-10-CM | POA: Diagnosis not present

## 2024-04-15 DIAGNOSIS — R531 Weakness: Secondary | ICD-10-CM | POA: Diagnosis not present

## 2024-04-15 DIAGNOSIS — M25671 Stiffness of right ankle, not elsewhere classified: Secondary | ICD-10-CM | POA: Diagnosis not present

## 2024-04-15 DIAGNOSIS — R262 Difficulty in walking, not elsewhere classified: Secondary | ICD-10-CM | POA: Diagnosis not present

## 2024-04-16 ENCOUNTER — Ambulatory Visit: Payer: Self-pay | Admitting: Medical

## 2024-04-19 DIAGNOSIS — R262 Difficulty in walking, not elsewhere classified: Secondary | ICD-10-CM | POA: Diagnosis not present

## 2024-04-19 DIAGNOSIS — M25671 Stiffness of right ankle, not elsewhere classified: Secondary | ICD-10-CM | POA: Diagnosis not present

## 2024-04-19 DIAGNOSIS — R531 Weakness: Secondary | ICD-10-CM | POA: Diagnosis not present

## 2024-04-21 DIAGNOSIS — R262 Difficulty in walking, not elsewhere classified: Secondary | ICD-10-CM | POA: Diagnosis not present

## 2024-04-21 DIAGNOSIS — R531 Weakness: Secondary | ICD-10-CM | POA: Diagnosis not present

## 2024-04-21 DIAGNOSIS — M25671 Stiffness of right ankle, not elsewhere classified: Secondary | ICD-10-CM | POA: Diagnosis not present

## 2024-04-26 DIAGNOSIS — M25671 Stiffness of right ankle, not elsewhere classified: Secondary | ICD-10-CM | POA: Diagnosis not present

## 2024-04-26 DIAGNOSIS — R531 Weakness: Secondary | ICD-10-CM | POA: Diagnosis not present

## 2024-04-26 DIAGNOSIS — R262 Difficulty in walking, not elsewhere classified: Secondary | ICD-10-CM | POA: Diagnosis not present

## 2024-04-28 ENCOUNTER — Encounter: Admitting: Skilled Nursing Facility1

## 2024-04-28 DIAGNOSIS — M25671 Stiffness of right ankle, not elsewhere classified: Secondary | ICD-10-CM | POA: Diagnosis not present

## 2024-04-28 DIAGNOSIS — R531 Weakness: Secondary | ICD-10-CM | POA: Diagnosis not present

## 2024-04-28 DIAGNOSIS — R262 Difficulty in walking, not elsewhere classified: Secondary | ICD-10-CM | POA: Diagnosis not present

## 2024-05-17 ENCOUNTER — Other Ambulatory Visit: Payer: Self-pay | Admitting: Medical

## 2025-01-20 ENCOUNTER — Encounter: Payer: Self-pay | Admitting: Medical
